# Patient Record
Sex: Male | Born: 1965 | Race: White | Hispanic: No | Marital: Married | Smoking: Never smoker
Health system: Southern US, Community
[De-identification: ages and names within clinical notes are randomized; demographics above are authoritative.]

## PROBLEM LIST (undated history)

## (undated) DIAGNOSIS — J45909 Unspecified asthma, uncomplicated: Secondary | ICD-10-CM

## (undated) HISTORY — PX: PLEURAL SCARIFICATION: SHX748

---

## 2011-02-08 ENCOUNTER — Telehealth (INDEPENDENT_AMBULATORY_CARE_PROVIDER_SITE_OTHER): Payer: Self-pay | Admitting: Emergency Medicine

## 2011-02-08 ENCOUNTER — Inpatient Hospital Stay (INDEPENDENT_AMBULATORY_CARE_PROVIDER_SITE_OTHER)
Admission: RE | Admit: 2011-02-08 | Discharge: 2011-02-08 | Disposition: A | Discharge status: Home / Self Care | Payer: Self-pay | Source: Ambulatory Visit | Attending: Emergency Medicine | Admitting: Emergency Medicine

## 2011-02-08 ENCOUNTER — Encounter: Payer: Self-pay | Admitting: Emergency Medicine

## 2011-02-08 DIAGNOSIS — M25529 Pain in unspecified elbow: Secondary | ICD-10-CM

## 2011-02-08 DIAGNOSIS — M7710 Lateral epicondylitis, unspecified elbow: Secondary | ICD-10-CM | POA: Insufficient documentation

## 2011-02-13 ENCOUNTER — Telehealth (INDEPENDENT_AMBULATORY_CARE_PROVIDER_SITE_OTHER): Payer: Self-pay | Admitting: Emergency Medicine

## 2011-06-14 NOTE — Telephone Encounter (Signed)
  Phone Note Outgoing Call   Call placed by: Lavell Islam RN,  February 13, 2011 10:02 AM Call placed to: Patient Action Taken: Phone Call Completed Summary of Call: Spoke with wife of patient; will phone in Rx for Mobic per Dr.Henderson. Initial call taken by: Lavell Islam RN,  February 13, 2011 10:03 AM

## 2011-06-14 NOTE — Telephone Encounter (Signed)
  Phone Note Outgoing Call Call back at [facility] Phone [phone]   Call placed by: Emilio Math,  February 08, 2011 1:38 PM Call placed to: Patient Summary of Call: Insurance company refused rx prescribed by Dr Orson Aloe.  So I called pt and left msg to use Aleve 2x per day or 800mg s ibuprofen q 8 hrs, continue to wear the brace.

## 2011-06-14 NOTE — Progress Notes (Signed)
Summary: LT ELBOW PAIN Room 5   Vital Signs:  Patient Profile:   45 Years Old Male CC:      Pain in leftelbow x 1 month Height:     74 inches Weight:      214 pounds O2 Sat:      84 % O2 treatment:    Room Air Temp:     98.4 degrees F oral Pulse rate:   84 / minute Pulse rhythm:   regular Resp:     16 per minute BP sitting:   121 / 84  (left arm) Cuff size:   regular  Vitals Entered By: Emilio Math (February 08, 2011 10:52 AM)                  Current Allergies: No known allergies History of Present Illness History from: patient Chief Complaint: Pain in leftelbow x 1 month History of Present Illness: L elbow pain for 1 month. 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  It hurts on the outside especially while moving his elbow and wrist.  No meds or modalities tried.  He is concerned with a tendon problem.  Not an athlete.    REVIEW OF SYSTEMS Constitutional Symptoms      Denies fever, chills, night sweats, weight loss, weight gain, and fatigue.  Eyes       Denies change in vision, eye pain, eye discharge, glasses, contact lenses, and eye surgery. Ear/Nose/Throat/Mouth       Denies hearing loss/aids, change in hearing, ear pain, ear discharge, dizziness, frequent runny nose, frequent nose bleeds, sinus problems, sore throat, hoarseness, and tooth pain or bleeding.  Respiratory       Denies dry cough, productive cough, wheezing, shortness of breath, asthma, bronchitis, and emphysema/COPD.  Cardiovascular       Denies murmurs, chest pain, and tires easily with exhertion.    Gastrointestinal       Denies stomach pain, nausea/vomiting, diarrhea, constipation, blood in bowel movements, and indigestion. Genitourniary       Denies painful urination, kidney stones, and loss of urinary control. Neurological       Denies paralysis, seizures, and fainting/blackouts. Musculoskeletal       Complains of joint pain, joint stiffness, and  decreased range of motion.      Denies muscle pain, redness, swelling, muscle weakness, and gout.  Skin       Denies bruising, unusual mles/lumps or sores, and hair/skin or nail changes.  Psych       Denies mood changes, temper/anger issues, anxiety/stress, speech problems, depression, and sleep problems.  Past History:  Past Surgical History: Pneumothrorax surgery 1992  Family History: Mother, diabetes, HTN Father, Healthy  Social History: Non smoker ETOH-yes No Drugs UPS Physical Exam General appearance: well developed, well nourished, no acute distress MSE: oriented to time, place, and person L elbow: FROM flex, ext, sup, pron.  TTP at lateral epicondyle and with resisted wrist extension.  No pain med epicondyle, olecranon, anterior, or with resisted wrist flexion.  No bruising or swelling.  Distal NV status intact. Assessment New Problems: ELBOW PAIN (ICD-719.42) LATERAL EPICONDYLITIS (ICD-726.32)   Plan New Medications/Changes: VOLTAREN 1 % GEL (DICLOFENAC SODIUM) apply two times a day for 2 weeks  #100g x 0, 02/08/2011, Hoyt Koch MD  New Orders: New Patient Level II [99202] Strapping of  elbow/wrist [29260] Planning Comments:   Given counterforce brace that helps immediately.  Also Rx for Voltaren gel.  Avoid lifting, pushing, pulling.  If not improving in 2 weeks, consider Xray and/or injection.   The patient and/or caregiver has been counseled thoroughly with regard to medications prescribed including dosage, schedule, interactions, rationale for use, and possible side effects and they verbalize understanding.  Diagnoses and expected course of recovery discussed and will return if not improved as expected or if the condition worsens. Patient and/or caregiver verbalized understanding.  Prescriptions: VOLTAREN 1 % GEL (DICLOFENAC SODIUM) apply two times a day for 2 weeks  #100g x 0   Entered and Authorized by:   Hoyt Koch MD   Signed by:   Hoyt Koch MD on 02/08/2011   Method used:   Electronically to        [facility] [phone]* (retail)       [address]       [place], [place]  [address]       Ph: [phone] or [phone]       Fax: [phone]   RxID:   [id number]   Orders Added: 1)  New Patient Level II [99202] 2)  Strapping of  elbow/wrist [29260]

## 2011-07-09 ENCOUNTER — Emergency Department (INDEPENDENT_AMBULATORY_CARE_PROVIDER_SITE_OTHER)
Admission: EM | Admit: 2011-07-09 | Discharge: 2011-07-09 | Disposition: A | Discharge status: Home / Self Care | Payer: 59 | Source: Home / Self Care | Attending: Family Medicine | Admitting: Family Medicine

## 2011-07-09 ENCOUNTER — Encounter: Payer: Self-pay | Admitting: Emergency Medicine

## 2011-07-09 DIAGNOSIS — J029 Acute pharyngitis, unspecified: Secondary | ICD-10-CM

## 2011-07-09 DIAGNOSIS — J069 Acute upper respiratory infection, unspecified: Secondary | ICD-10-CM

## 2011-07-09 NOTE — ED Provider Notes (Signed)
History     CSN: [id number]  Arrival date & time 07/09/11  1043   First MD Initiated Contact with Patient 07/09/11 1246      Chief Complaint  Patient presents with  . Sore Throat      HPI Comments: Patient complains of approximately 3 to 4 day history of gradually progressive URI symptoms beginning with a mild sore throat (now improved), followed by progressive nasal congestion.  A cough followed.  Complains of fatigue but no myalgias.  Cough is now worse at night and generally non-productive during the day.  There has been no pleuritic pain, shortness of breath, or wheezes.  He has not had a flu shot this season.  Patient is a 45 y.o. male presenting with pharyngitis. The history is provided by the patient.  Sore Throat This is a new problem. The current episode started more than 2 days ago. The problem occurs constantly. The problem has been gradually worsening. Pertinent negatives include no chest pain, no abdominal pain, no headaches and no shortness of breath. The symptoms are aggravated by swallowing and coughing. The symptoms are relieved by nothing.    Past Medical History  Diagnosis Date  . Pneumothorax     Past Surgical History  Procedure Date  . Pleural scarification     Family History  Problem Relation Age of Onset  . Diabetes Mother   . Hypertension Mother   . Hypertension Father     History  Substance Use Topics  . Smoking status: Never Smoker   . Smokeless tobacco: Not on file  . Alcohol Use: No      Review of Systems  Respiratory: Negative for shortness of breath.   Cardiovascular: Negative for chest pain.  Gastrointestinal: Negative for abdominal pain.  Neurological: Negative for headaches.   + sore throat + cough No pleuritic pain No wheezing + nasal congestion + post-nasal drainage No sinus pain/pressure No itchy/red eyes No earache No hemoptysis No SOB No fever/chills No nausea No vomiting No abdominal pain No diarrhea No  urinary symptoms No skin rashes + fatigue No myalgias No headache Used OTC meds without relief  Allergies  Review of patient's allergies indicates no known allergies.  Home Medications  No current outpatient prescriptions on file.  BP 104/77  Pulse 92  Temp(Src) 98.2 F (36.8 C) (Oral)  Resp 16  Ht 6\' 2"  (1.88 m)  Wt 214 lb (97.07 kg)  BMI 27.48 kg/m2  SpO2 98%  Physical Exam Nursing notes and Vital Signs reviewed. Appearance:  Patient appears healthy, stated age, and in no acute distress Eyes:  Pupils are equal, round, and reactive to light and accomodation.  Extraocular movement is intact.  Conjunctivae are not inflamed  Ears:  Canals normal.  Tympanic membranes normal.  Nose:  Mildly congested turbinates.  No sinus tenderness.   Pharynx:  Normal Neck:  Supple.  Slightly tender shotty anterior/posterior nodes are palpated bilaterally  Lungs:  Clear to auscultation.  Breath sounds are equal.  Heart:  Regular rate and rhythm without murmurs, rubs, or gallops.  Abdomen:  Nontender without masses or hepatosplenomegaly.  Bowel sounds are present.  No CVA or flank tenderness.  Extremities:  No edema.  No calf tenderness Skin:  No rash present.   ED Course  Procedures none   Labs Reviewed  POCT RAPID STREP A (OFFICE) Negative      1. Acute pharyngitis   2. Acute upper respiratory infections of unspecified site       MDM  There is no evidence of bacterial infection today.   Treat symptomatically for now: Take Mucinex D (guaifenesin with decongestant) twice daily for congestion.  Increase fluid intake, rest. May use Afrin nasal spray (or generic oxymetazoline) twice daily for about 5 days.  Also recommend using saline nasal spray several times daily and/or saline nasal irrigation. Stop all antihistamines for now, and other non-prescription cough/cold preparations. May take Delsym Cough Suppressant at bedtime for nighttime cough.  Recommend flu shot when  well. Follow-up with family doctor if not improving about one week.         Donna Christen, MD 07/09/11 [phone]

## 2011-07-09 NOTE — ED Notes (Signed)
Sore throat and congestion x 4 days; son dx with Type A Flu 2 weeks ago; pt. Has not had flu vaccination.

## 2011-07-12 ENCOUNTER — Telehealth: Payer: Self-pay | Admitting: Emergency Medicine

## 2012-05-10 ENCOUNTER — Emergency Department (INDEPENDENT_AMBULATORY_CARE_PROVIDER_SITE_OTHER)
Admission: EM | Admit: 2012-05-10 | Discharge: 2012-05-10 | Disposition: A | Discharge status: Home / Self Care | Payer: 59 | Source: Home / Self Care | Attending: Family Medicine | Admitting: Family Medicine

## 2012-05-10 ENCOUNTER — Encounter: Payer: Self-pay | Admitting: *Deleted

## 2012-05-10 DIAGNOSIS — J069 Acute upper respiratory infection, unspecified: Secondary | ICD-10-CM

## 2012-05-10 HISTORY — DX: Unspecified asthma, uncomplicated: J45.909

## 2012-05-10 MED ORDER — BENZONATATE 200 MG PO CAPS: 200.0000 mg | ORAL_CAPSULE | Freq: Every day | ORAL | Status: DC

## 2012-05-10 MED ORDER — AZITHROMYCIN 250 MG PO TABS: ORAL_TABLET | ORAL | Status: DC

## 2012-05-10 NOTE — ED Notes (Signed)
Patient c/o fever, chills, sinus congestion (yellow mucous), body aches and "burning throat at times". Denies painful swallowing.

## 2012-05-10 NOTE — ED Provider Notes (Signed)
History     CSN: [id number]  Arrival date & time 05/10/12  1537   First MD Initiated Contact with Patient 05/10/12 [phone]      Chief Complaint  Patient presents with  . Generalized Body Aches  . Nasal Congestion      HPI Comments: Patient complains of 2 day history of gradually progressive URI symptoms beginning with low grade fever, myalgias, sweats, headache, and fatigue.  A sore throat started yesterday. He has had minimal cough. There has been no pleuritic pain, shortness of breath, or wheezes.  Several family members with similar illness.  The history is provided by the patient.    Past Medical History  Diagnosis Date  . Pneumothorax   . Asthma     Past Surgical History  Procedure Date  . Pleural scarification     Family History  Problem Relation Age of Onset  . Diabetes Mother   . Hypertension Mother   . Hypertension Father     History  Substance Use Topics  . Smoking status: Never Smoker   . Smokeless tobacco: Not on file  . Alcohol Use: No      Review of Systems + sore throat ? cough No pleuritic pain No wheezing + nasal congestion + post-nasal drainage No sinus pain/pressure No itchy/red eyes No earache No hemoptysis No SOB + fever/chills No nausea No vomiting No abdominal pain No diarrhea No urinary symptoms No skin rashes + fatigue + myalgias + headache Used OTC meds without relief  Allergies  Review of patient's allergies indicates no known allergies.  Home Medications   Current Outpatient Rx  Name Route Sig Dispense Refill  . AZITHROMYCIN 250 MG PO TABS  Take 2 tabs today; then begin one tab once daily for 4 more days (Rx void after 05/18/12) 6 each 0  . BENZONATATE 200 MG PO CAPS Oral Take 1 capsule (200 mg total) by mouth at bedtime. Take as needed for cough 12 capsule 0    BP 141/84  Pulse 89  Temp 98.2 F (36.8 C) (Oral)  Resp 14  Ht 6\' 1"  (1.854 m)  Wt 205 lb (92.987 kg)  BMI 27.05 kg/m2  SpO2 98%  Physical  Exam Nursing notes and Vital Signs reviewed. Appearance:  Patient appears healthy, stated age, and in no acute distress Eyes:  Pupils are equal, round, and reactive to light and accomodation.  Extraocular movement is intact.  Conjunctivae are not inflamed  Ears:  Canals normal.  Tympanic membranes normal.  Nose:  Mildly congested turbinates.  No sinus tenderness.   Pharynx:  Minimal erythema Neck:  Supple.  Tender posterior nodes are palpated bilaterally  Lungs:  Clear to auscultation.  Breath sounds are equal.  Heart:  Regular rate and rhythm without murmurs, rubs, or gallops.  Abdomen:  Nontender without masses or hepatosplenomegaly.  Bowel sounds are present.  No CVA or flank tenderness.  Extremities:  No edema.  No calf tenderness Skin:  No rash present.   ED Course  Procedures none      1. Acute upper respiratory infections of unspecified site; suspect early viral URI.  Centor score 0       MDM   There is no evidence of bacterial infection today.   Treat symptomatically for now  Prescription written for Benzonatate (Tessalon) to take at bedtime for night-time cough.  Take Mucinex D (guaifenesin with decongestant) twice daily for congestion.  Increase fluid intake, rest. May use Afrin nasal spray (or generic oxymetazoline) twice daily  for about 5 days.  Also recommend using saline nasal spray several times daily and saline nasal irrigation (AYR is a common brand) Stop all antihistamines for now, and other non-prescription cough/cold preparations. May take Ibuprofen 200mg , 4 tabs every 8 hours with food for sore throat, headache, body aches, etc. Begin Azithromycin if not improving about 5 days or if persistent fever develops. Follow-up with family doctor if not improving 7 to 10 days        Lattie Haw, MD 05/10/12 [phone]

## 2012-05-11 MED ORDER — ALBUTEROL SULFATE HFA 108 (90 BASE) MCG/ACT IN AERS: 2.0000 | INHALATION_SPRAY | RESPIRATORY_TRACT | Status: DC | PRN

## 2012-09-19 ENCOUNTER — Emergency Department (INDEPENDENT_AMBULATORY_CARE_PROVIDER_SITE_OTHER)
Admission: EM | Admit: 2012-09-19 | Discharge: 2012-09-19 | Disposition: A | Discharge status: Home / Self Care | Payer: 59 | Source: Home / Self Care

## 2012-09-19 ENCOUNTER — Encounter: Payer: Self-pay | Admitting: *Deleted

## 2012-09-19 DIAGNOSIS — S336XXA Sprain of sacroiliac joint, initial encounter: Secondary | ICD-10-CM

## 2012-09-19 MED ORDER — CYCLOBENZAPRINE HCL 10 MG PO TABS: 10.0000 mg | ORAL_TABLET | Freq: Three times a day (TID) | ORAL | Status: DC | PRN

## 2012-09-19 NOTE — ED Provider Notes (Signed)
History     CSN: [id number]  Arrival date & time 09/19/12  1443   None     Chief Complaint  Patient presents with  . Back Pain       HPI Comments: Patient complains of two day history of low back pain without a precipitating event or injury.  The pain is better when sitting, and worse standing/walking.  No bowel or bladder dysfunction.  No saddle numbness.  Patient is a 47 y.o. male presenting with back pain. The history is provided by the patient.  Back Pain Location:  Lumbar spine Quality:  Shooting Radiates to:  Does not radiate Pain severity:  Moderate Pain is:  Worse during the day Onset quality:  Gradual Duration:  1 day Timing:  Constant Progression:  Unchanged Chronicity:  Recurrent Relieved by:  Nothing Ineffective treatments:  NSAIDs Associated symptoms: no abdominal pain, no abdominal swelling, no bladder incontinence, no bowel incontinence, no chest pain, no dysuria, no fever, no headaches, no leg pain, no numbness, no paresthesias, no perianal numbness, no tingling, no weakness and no weight loss     Past Medical History  Diagnosis Date  . Pneumothorax   . Asthma     Past Surgical History  Procedure Laterality Date  . Pleural scarification      Family History  Problem Relation Age of Onset  . Diabetes Mother   . Hypertension Mother   . Hypertension Father     History  Substance Use Topics  . Smoking status: Never Smoker   . Smokeless tobacco: Not on file  . Alcohol Use: No      Review of Systems  Constitutional: Negative for fever and weight loss.  Cardiovascular: Negative for chest pain.  Gastrointestinal: Negative for abdominal pain and bowel incontinence.  Genitourinary: Negative for bladder incontinence and dysuria.  Musculoskeletal: Positive for back pain.  Neurological: Negative for tingling, weakness, numbness, headaches and paresthesias.  All other systems reviewed and are negative.    Allergies  Review of patient's  allergies indicates no known allergies.  Home Medications   Current Outpatient Rx  Name  Route  Sig  Dispense  Refill  . albuterol (PROVENTIL HFA;VENTOLIN HFA) 108 (90 BASE) MCG/ACT inhaler   Inhalation   Inhale 2 puffs into the lungs every 4 (four) hours as needed for wheezing.   1 Inhaler   0   . cyclobenzaprine (FLEXERIL) 10 MG tablet   Oral   Take 1 tablet (10 mg total) by mouth 3 (three) times daily as needed for muscle spasms.   20 tablet   1     BP 121/72  Pulse 73  Temp(Src) 97.8 F (36.6 C) (Oral)  Ht 6\' 2"  (1.88 m)  Wt 209 lb (94.802 kg)  BMI 26.82 kg/m2  SpO2 99%  Physical Exam Nursing notes and Vital Signs reviewed. Appearance:  Patient appears healthy, stated age, and in no acute distress Eyes:  Pupils are equal, round, and reactive to light and accomodation.  Extraocular movement is intact.  Conjunctivae are not inflamed  Pharynx:  Normal Neck:  Supple.  No adenopathy  Lungs:  Clear to auscultation.  Breath sounds are equal.  Heart:  Regular rate and rhythm without murmurs, rubs, or gallops.  Abdomen:  Nontender without masses or hepatosplenomegaly.  Bowel sounds are present.  No CVA or flank tenderness.  Extremities:  No edema.  No calf tenderness Skin:  No rash present.  Back:   Can heel/toe walk and squat without difficulty.  Decreased  forward flexion.  Tenderness in the midline and bilateral paraspinous muscles from L3/4 to Sacral area.  Straight leg raising test is negative.  Sitting knee extension test is negative.  Strength and sensation in the lower extremities is normal.  Patellar and achilles reflexes are normal   ED Course  Procedures  none      1. Low back sprain, initial encounter       MDM  Begin Flexeril. Apply ice pack for 30 to 45 minutes three or four times daily.  Continue until pain decreases.  May take Ibuprofen 200mg , 4 tabs every 8 hours with food.  Begin back stretching and range of motion exercises in about 3 to 5 days  [employer] information and instruction handout given)  Followup with [facility] if not improving about two weeks.         Lattie Haw, MD 09/25/12 2029

## 2012-09-19 NOTE — ED Notes (Signed)
Pt c/o low back pain x 1 day. Denies injury. He took IBF yesterday with no relief.

## 2013-04-21 ENCOUNTER — Encounter: Payer: Self-pay | Admitting: Emergency Medicine

## 2013-04-21 ENCOUNTER — Emergency Department (INDEPENDENT_AMBULATORY_CARE_PROVIDER_SITE_OTHER)
Admission: EM | Admit: 2013-04-21 | Discharge: 2013-04-21 | Disposition: A | Discharge status: Home / Self Care | Payer: 59 | Source: Home / Self Care | Attending: Family Medicine | Admitting: Family Medicine

## 2013-04-21 ENCOUNTER — Emergency Department (INDEPENDENT_AMBULATORY_CARE_PROVIDER_SITE_OTHER): Payer: 59

## 2013-04-21 DIAGNOSIS — M7742 Metatarsalgia, left foot: Secondary | ICD-10-CM

## 2013-04-21 DIAGNOSIS — M79609 Pain in unspecified limb: Secondary | ICD-10-CM

## 2013-04-21 DIAGNOSIS — M25572 Pain in left ankle and joints of left foot: Secondary | ICD-10-CM

## 2013-04-21 DIAGNOSIS — M25579 Pain in unspecified ankle and joints of unspecified foot: Secondary | ICD-10-CM

## 2013-04-21 MED ORDER — MELOXICAM 15 MG PO TABS: 15.0000 mg | ORAL_TABLET | Freq: Every day | ORAL | Status: DC

## 2013-04-21 NOTE — ED Provider Notes (Signed)
CSN: [id number]     Arrival date & time 04/21/13  1243 History   First MD Initiated Contact with Patient 04/21/13 1246     Chief Complaint  Patient presents with  . Foot Pain    HPI  L foot pain x 2 weeks No known injury.  Does stand for prolonged periods of time with work (works at [employer]).  Prior hx/o plantar fasciitis.  Pain predominantly over 3rd-5th metatarsals.  On dorsal and plantar aspects, though predominantly on plantar surface.  Has tried OTC insoles with minimal improvement in sxs.  No burning or tingling.  Pain worse after prolonged shifts at work.  Past Medical History  Diagnosis Date  . Pneumothorax   . Asthma    Past Surgical History  Procedure Laterality Date  . Pleural scarification     Family History  Problem Relation Age of Onset  . Diabetes Mother   . Hypertension Mother   . Hypertension Father    History  Substance Use Topics  . Smoking status: Never Smoker   . Smokeless tobacco: Not on file  . Alcohol Use: No    Review of Systems  All other systems reviewed and are negative.    Allergies  Review of patient's allergies indicates no known allergies.  Home Medications   Current Outpatient Rx  Name  Route  Sig  Dispense  Refill  . albuterol (PROVENTIL HFA;VENTOLIN HFA) 108 (90 BASE) MCG/ACT inhaler   Inhalation   Inhale 2 puffs into the lungs every 4 (four) hours as needed for wheezing.   1 Inhaler   0   . cyclobenzaprine (FLEXERIL) 10 MG tablet   Oral   Take 1 tablet (10 mg total) by mouth 3 (three) times daily as needed for muscle spasms.   20 tablet   1    BP 102/67  Temp(Src) 97.5 F (36.4 C) (Oral)  Resp 16  Ht 6\' 2"  (1.88 m)  Wt 206 lb (93.441 kg)  BMI 26.44 kg/m2  SpO2 97% Physical Exam  Constitutional: He appears well-developed and well-nourished.  HENT:  Head: Normocephalic and atraumatic.  Eyes: Conjunctivae are normal. Pupils are equal, round, and reactive to light.  Neck: Normal range of motion. Neck supple.    Cardiovascular: Normal rate and regular rhythm.   Pulmonary/Chest: Effort normal and breath sounds normal.  Abdominal: Soft.  Musculoskeletal:       Feet:  + TTP over affected area predominantly on plantar aspect.  Neurovascularly intact able to bear weight.  No plantar TTP with toe dorsiflexion.    Neurological: He is alert.  Skin: Skin is warm.    ED Course  Procedures (including critical care time) Labs Review Labs Reviewed - No data to display Imaging Review No results found.    MDM   1. Pain in joint, ankle and foot, left   2. Metatarsalgia, left    Exam consistent with metatarsalgia.  Xray WNL.  RICE and NSAIDs.  Post op shoe with metarsal pad. Symptomatic improvement at bedside.  Discussed general and MSK red flags at length with pt.  Follow up with sports medicine in 1-2 weeks if sxs not improved.     The patient and/or caregiver has been counseled thoroughly with regard to treatment plan and/or medications prescribed including dosage, schedule, interactions, rationale for use, and possible side effects and they verbalize understanding. Diagnoses and expected course of recovery discussed and will return if not improved as expected or if the condition worsens. Patient and/or caregiver verbalized understanding.  Doree Albee, MD 04/21/13 1339

## 2013-04-21 NOTE — ED Notes (Signed)
Reports left foot pain without known injury x 2 weeks; wondered if plantar faschitis (which he has had in past), but is in different areas of foot. Has iced and did take ibuprofen 2 days ago.

## 2014-02-09 IMAGING — CR DG FOOT COMPLETE 3+V*L*
3 series · 3 of 3 positions shown · non-contrast
Comparison: None.

CLINICAL DATA: Pain across metatarsal area of left foot after
walking on a heart surface 1 and half days ago.

EXAM:
LEFT FOOT - COMPLETE 3+ VIEW

[view not recorded (1 of 3)]
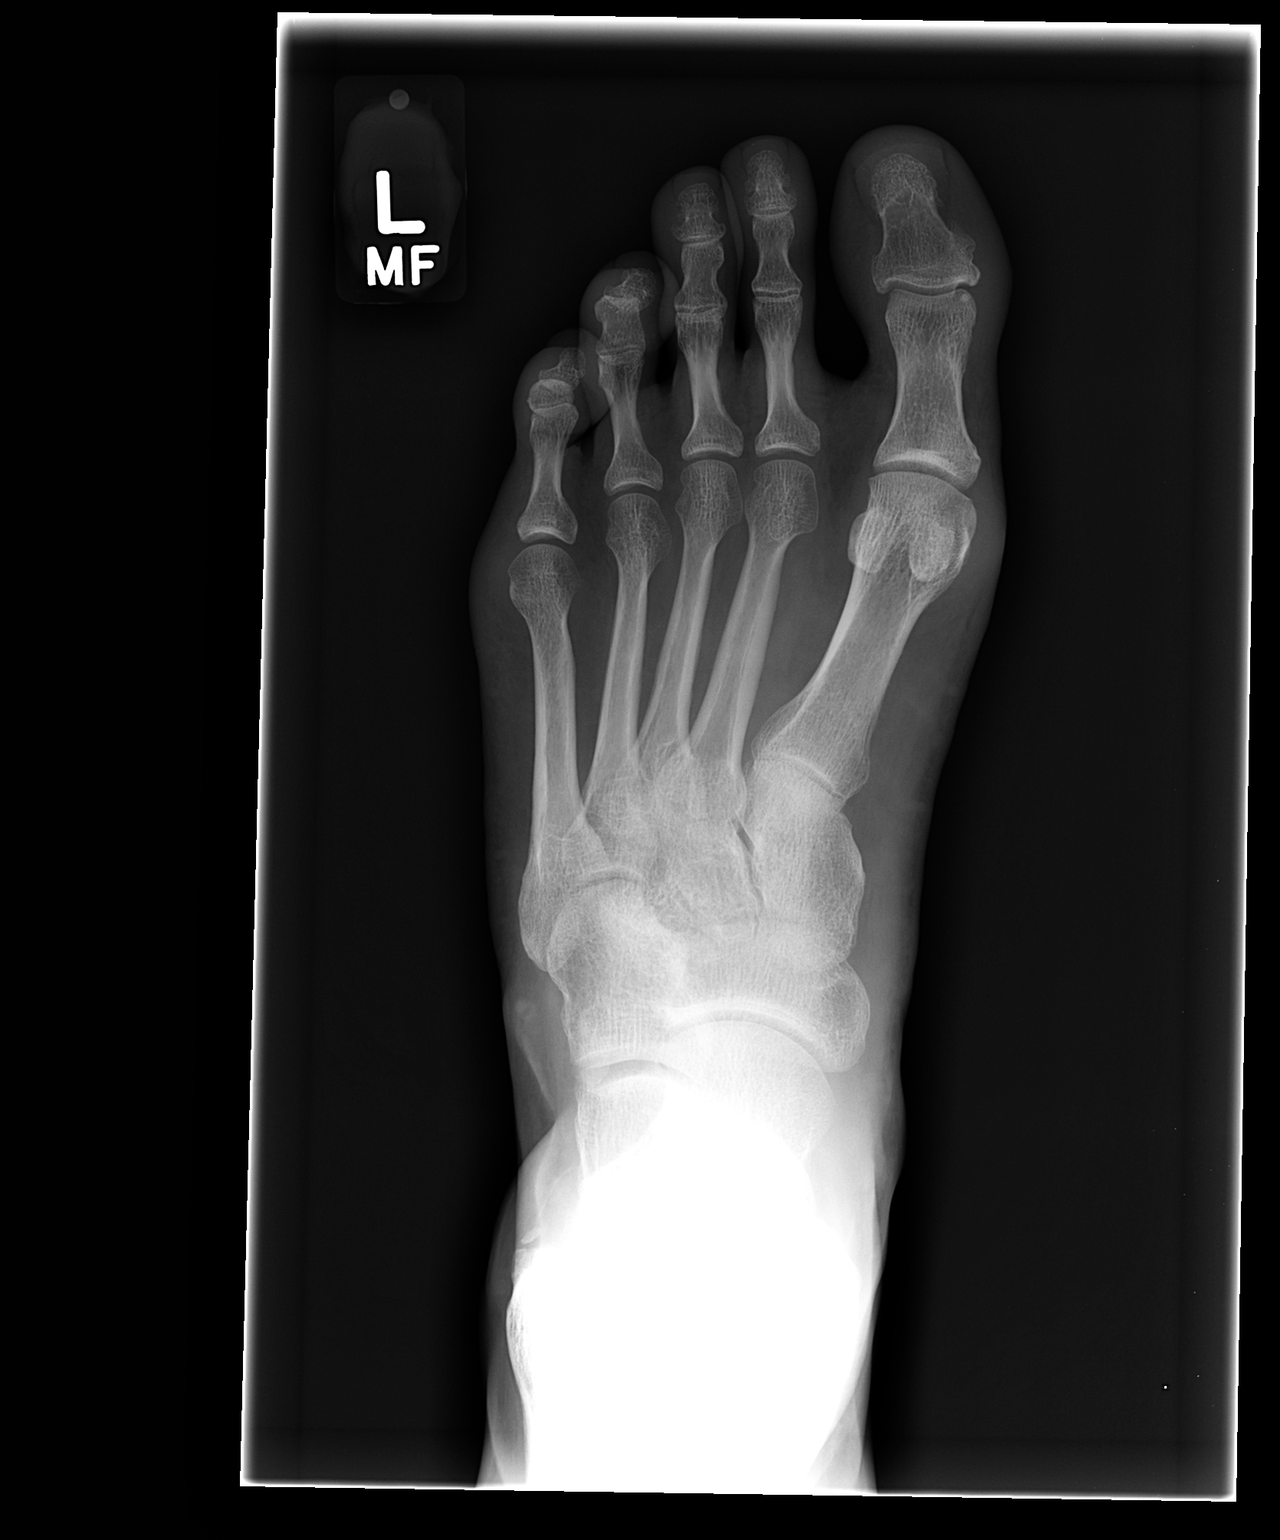

[view not recorded (2 of 3)]
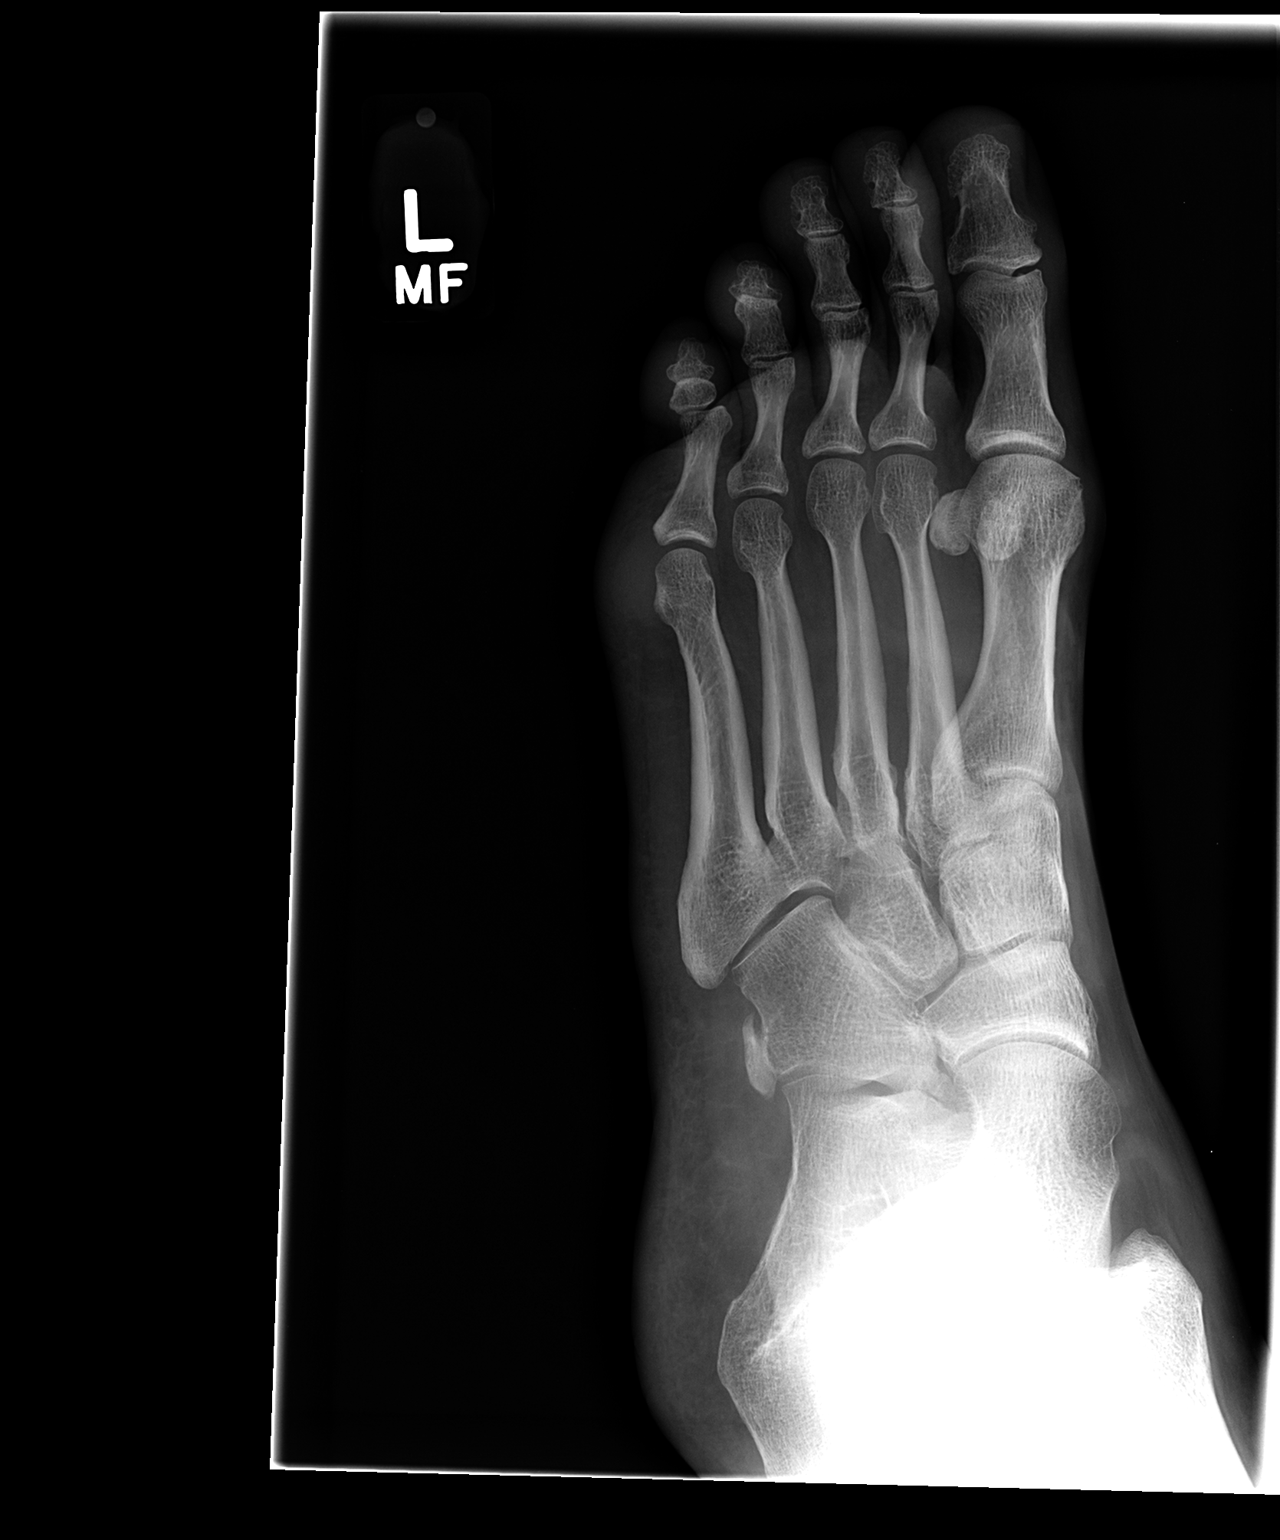

[view not recorded (3 of 3)]
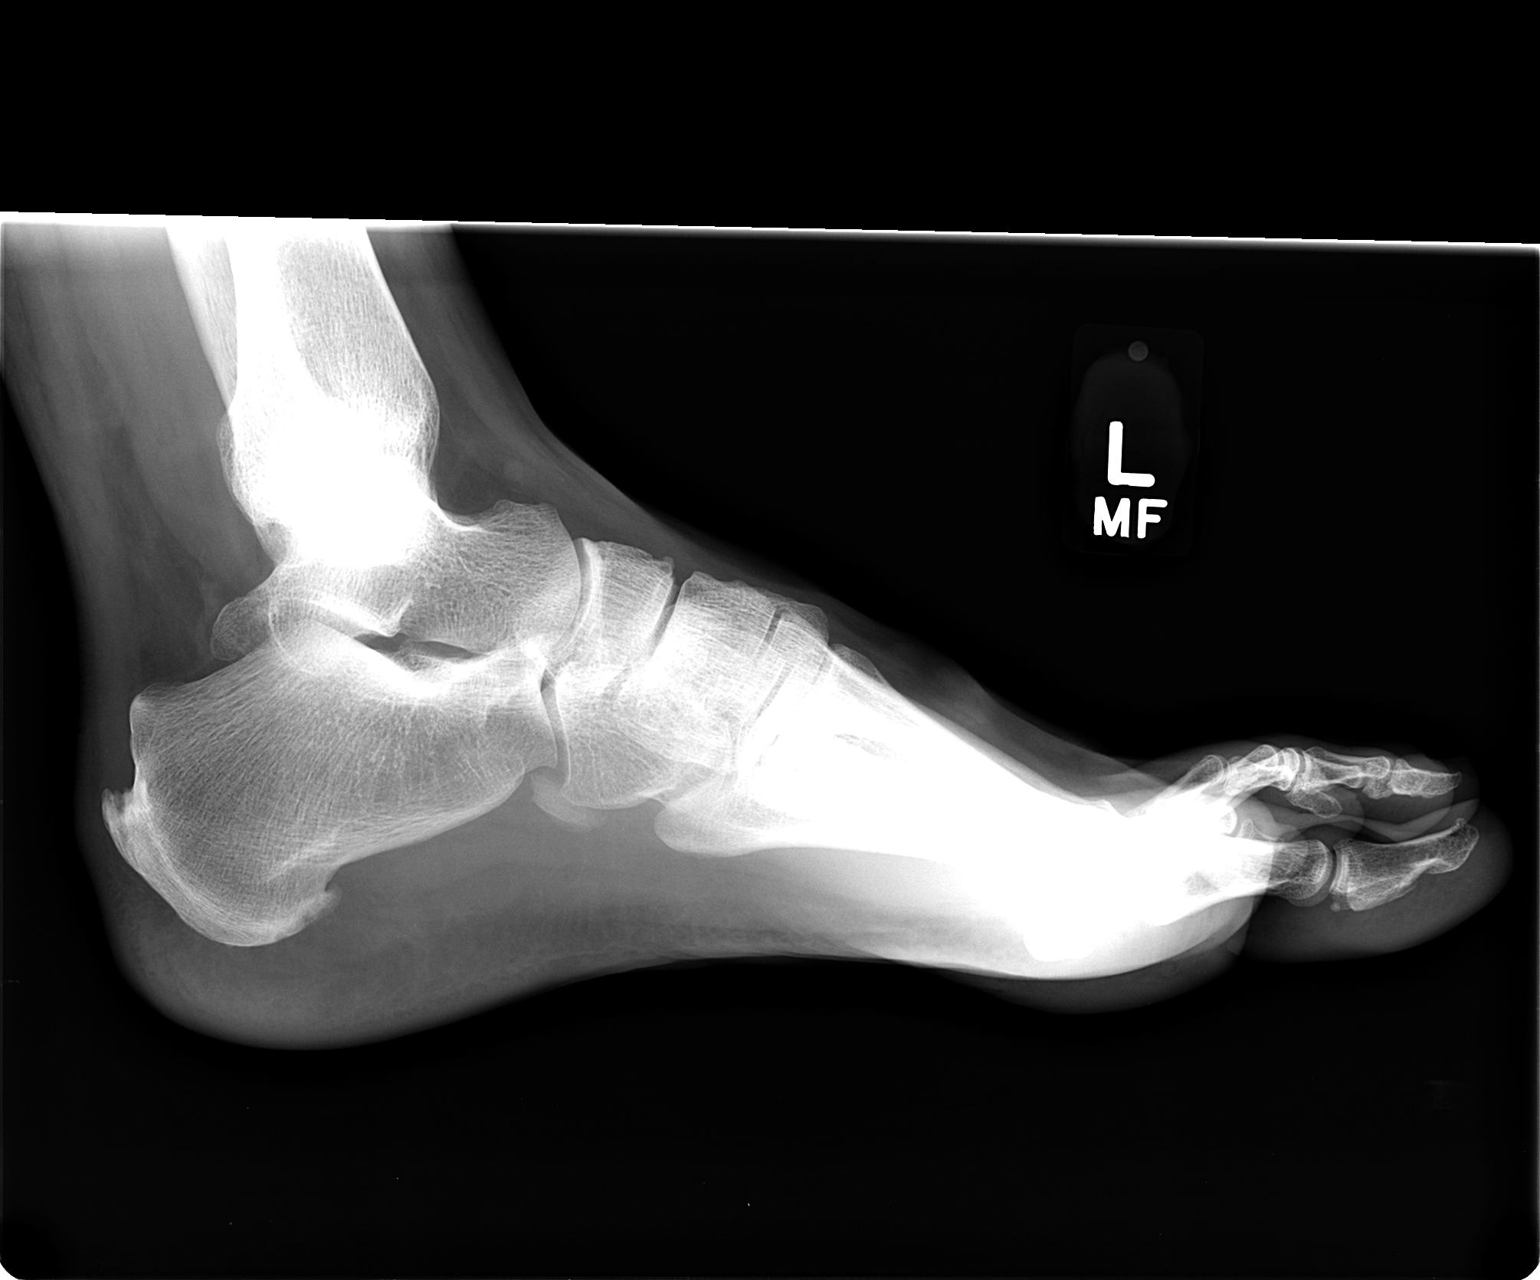

[3 of 3 positions shown; findings below may reference images not displayed]

FINDINGS: There is no evidence of fracture or dislocation. No evidence of a
metatarsal stress fracture. There is no evidence of arthropathy or
other focal bone abnormality. Soft tissues are unremarkable.
IMPRESSION: Negative.

## 2014-05-21 ENCOUNTER — Emergency Department (INDEPENDENT_AMBULATORY_CARE_PROVIDER_SITE_OTHER)
Admission: EM | Admit: 2014-05-21 | Discharge: 2014-05-21 | Disposition: A | Discharge status: Home / Self Care | Payer: 59 | Source: Home / Self Care | Attending: Physician Assistant | Admitting: Physician Assistant

## 2014-05-21 ENCOUNTER — Encounter: Payer: Self-pay | Admitting: *Deleted

## 2014-05-21 DIAGNOSIS — L259 Unspecified contact dermatitis, unspecified cause: Secondary | ICD-10-CM

## 2014-05-21 MED ORDER — CEPHALEXIN 500 MG PO CAPS: 500.0000 mg | ORAL_CAPSULE | Freq: Two times a day (BID) | ORAL | Status: DC

## 2014-05-21 MED ORDER — TRIAMCINOLONE ACETONIDE 0.1 % EX OINT: 1.0000 "application " | TOPICAL_OINTMENT | Freq: Two times a day (BID) | CUTANEOUS | Status: DC

## 2014-05-21 NOTE — ED Provider Notes (Signed)
CSN: [id number]     Arrival date & time 05/21/14  1148 History   First MD Initiated Contact with Patient 05/21/14 1202     Chief Complaint  Patient presents with  . Insect Bite   (Consider location/radiation/quality/duration/timing/severity/associated sxs/prior Treatment) HPI  Pt presents to the clinic with what he believes is a insect bit to back of left ear lobe 6 days ago. Describes itching and swelling but no pain or tingling. He has used calamine, hydrogen peroxide and neosporin. Does not seem to be spreading but not getting better. No fever, chills, n/v/d. Admits to being in woods a lot last week hunting.   Past Medical History  Diagnosis Date  . Pneumothorax   . Asthma    Past Surgical History  Procedure Laterality Date  . Pleural scarification     Family History  Problem Relation Age of Onset  . Diabetes Mother   . Hypertension Mother   . Hypertension Father    History  Substance Use Topics  . Smoking status: Never Smoker   . Smokeless tobacco: Not on file  . Alcohol Use: No    Review of Systems  All other systems reviewed and are negative.   Allergies  Review of patient's allergies indicates no known allergies.  Home Medications   Prior to Admission medications   Medication Sig Start Date End Date Taking? Authorizing Provider  albuterol (PROVENTIL HFA;VENTOLIN HFA) 108 (90 BASE) MCG/ACT inhaler Inhale 2 puffs into the lungs every 4 (four) hours as needed for wheezing. 05/11/12 05/11/13  Lattie HawStephen A Beese, MD  cephALEXin (KEFLEX) 500 MG capsule Take 1 capsule (500 mg total) by mouth 2 (two) times daily. For 7 days. 05/21/14   Myers Tutterow L Raziel Koenigs, PA-C  cyclobenzaprine (FLEXERIL) 10 MG tablet Take 1 tablet (10 mg total) by mouth 3 (three) times daily as needed for muscle spasms. 09/19/12   Lattie HawStephen A Beese, MD  meloxicam (MOBIC) 15 MG tablet Take 1 tablet (15 mg total) by mouth daily. 04/21/13   Doree AlbeeSteven Newton, MD  triamcinolone ointment (KENALOG) 0.1 % Apply 1  application topically 2 (two) times daily. 05/21/14   Kazue Cerro L Etha Stambaugh, PA-C   BP 104/69 mmHg  Pulse 95  Temp(Src) 98.6 F (37 C) (Oral)  Resp 16  Ht 6\' 2"  (1.88 m)  Wt 213 lb (96.616 kg)  BMI 27.34 kg/m2  SpO2 98% Physical Exam  Constitutional: He is oriented to person, place, and time. He appears well-developed and well-nourished.  HENT:  Head: Normocephalic and atraumatic.  Right Ear: External ear normal.  Nose: Nose normal.  Mouth/Throat: Oropharynx is clear and moist. No oropharyngeal exudate.  Left posterior ear lob erythema with what appears to be a blister that is closed. Surround dry scaly appearance.   Eyes: Conjunctivae are normal.  Neck: Normal range of motion. Neck supple.  Lymphadenopathy:    He has no cervical adenopathy.  Neurological: He is alert and oriented to person, place, and time.  Skin: Skin is dry.  Psychiatric: He has a normal mood and affect. His behavior is normal.    ED Course  Procedures (including critical care time) Labs Review Labs Reviewed - No data to display  Imaging Review No results found.   MDM   1. Contact dermatitis    Discussed appears to be inflamed and not infected.  Appears like he came in contact with something that gave him a reaction.  Given triamcinolone bid for no more than 2 weeks.  No improvement or worsening given keflex(printed  out) for 7 days.  Follow up with any new worsening symptoms.  Stop all other OTC treatments. Given HO for contact dermatitis.      Jomarie LongsJade L Tiberius Loftus, PA-C 05/21/14 1222

## 2014-05-21 NOTE — ED Notes (Signed)
Pt c/o insect bite to his LT ear x 6 days ago with itching and swelling. He has applied Calamine lotion, peroxide and neosporin.

## 2014-05-21 NOTE — Discharge Instructions (Signed)
Don't use keflex unless worsening or no improvement in 48 hours.   Contact Dermatitis Contact dermatitis is a reaction to certain substances that touch the skin. Contact dermatitis can be either irritant contact dermatitis or allergic contact dermatitis. Irritant contact dermatitis does not require previous exposure to the substance for a reaction to occur.Allergic contact dermatitis only occurs if you have been exposed to the substance before. Upon a repeat exposure, your body reacts to the substance.  CAUSES  Many substances can cause contact dermatitis. Irritant dermatitis is most commonly caused by repeated exposure to mildly irritating substances, such as:  Makeup.  Soaps.  Detergents.  Bleaches.  Acids.  Metal salts, such as nickel. Allergic contact dermatitis is most commonly caused by exposure to:  Poisonous plants.  Chemicals (deodorants, shampoos).  Jewelry.  Latex.  Neomycin in triple antibiotic cream.  Preservatives in products, including clothing. SYMPTOMS  The area of skin that is exposed may develop:  Dryness or flaking.  Redness.  Cracks.  Itching.  Pain or a burning sensation.  Blisters. With allergic contact dermatitis, there may also be swelling in areas such as the eyelids, mouth, or genitals.  DIAGNOSIS  Your caregiver can usually tell what the problem is by doing a physical exam. In cases where the cause is uncertain and an allergic contact dermatitis is suspected, a patch skin test may be performed to help determine the cause of your dermatitis. TREATMENT Treatment includes protecting the skin from further contact with the irritating substance by avoiding that substance if possible. Barrier creams, powders, and gloves may be helpful. Your caregiver may also recommend:  Steroid creams or ointments applied 2 times daily. For best results, soak the rash area in cool water for 20 minutes. Then apply the medicine. Cover the area with a plastic  wrap. You can store the steroid cream in the refrigerator for a "chilly" effect on your rash. That may decrease itching. Oral steroid medicines may be needed in more severe cases.  Antibiotics or antibacterial ointments if a skin infection is present.  Antihistamine lotion or an antihistamine taken by mouth to ease itching.  Lubricants to keep moisture in your skin.  Burow's solution to reduce redness and soreness or to dry a weeping rash. Mix one packet or tablet of solution in 2 cups cool water. Dip a clean washcloth in the mixture, wring it out a bit, and put it on the affected area. Leave the cloth in place for 30 minutes. Do this as often as possible throughout the day.  Taking several cornstarch or baking soda baths daily if the area is too large to cover with a washcloth. Harsh chemicals, such as alkalis or acids, can cause skin damage that is like a burn. You should flush your skin for 15 to 20 minutes with cold water after such an exposure. You should also seek immediate medical care after exposure. Bandages (dressings), antibiotics, and pain medicine may be needed for severely irritated skin.  HOME CARE INSTRUCTIONS  Avoid the substance that caused your reaction.  Keep the area of skin that is affected away from hot water, soap, sunlight, chemicals, acidic substances, or anything else that would irritate your skin.  Do not scratch the rash. Scratching may cause the rash to become infected.  You may take cool baths to help stop the itching.  Only take over-the-counter or prescription medicines as directed by your caregiver.  See your caregiver for follow-up care as directed to make sure your skin is healing properly.  SEEK MEDICAL CARE IF:   Your condition is not better after 3 days of treatment.  You seem to be getting worse.  You see signs of infection such as swelling, tenderness, redness, soreness, or warmth in the affected area.  You have any problems related to your  medicines. Document Released: 06/25/2000 Document Revised: 09/20/2011 Document Reviewed: 12/01/2010 [facility] Patient Information 2015 [place], [place]. This information is not intended to replace advice given to you by your health care provider. Make sure you discuss any questions you have with your health care provider.

## 2016-10-08 ENCOUNTER — Encounter: Payer: Self-pay | Admitting: Emergency Medicine

## 2016-10-08 ENCOUNTER — Emergency Department (INDEPENDENT_AMBULATORY_CARE_PROVIDER_SITE_OTHER)
Admission: EM | Admit: 2016-10-08 | Discharge: 2016-10-08 | Disposition: A | Discharge status: Home / Self Care | Payer: BLUE CROSS/BLUE SHIELD | Source: Home / Self Care | Attending: Family Medicine | Admitting: Family Medicine

## 2016-10-08 DIAGNOSIS — M7021 Olecranon bursitis, right elbow: Secondary | ICD-10-CM

## 2016-10-08 NOTE — ED Triage Notes (Signed)
Pt c/o hit his right elbow about 4 months ago, twice in the same spot, no pain, just feeling some inflammation.

## 2016-10-08 NOTE — ED Provider Notes (Signed)
[name]    CSN: [id number] Arrival date & time: 10/08/16  1526     History   Chief Complaint Chief Complaint  Patient presents with  . Joint Swelling    HPI Travis Welch is a 51 y.o. male.   Patient reports that he hit his right elbow twice about 4 months ago, and has had some persistent mild swelling with minimal soreness.   The history is provided by the patient.  Arm Injury  Location:  Elbow Elbow location:  R elbow Injury: yes   Time since incident:  4 months Mechanism of injury comment:  Contusion Pain details:    Severity:  No pain   Onset quality:  Gradual   Duration:  4 months   Timing:  Constant   Progression:  Unchanged Dislocation: no   Prior injury to area:  No Relieved by:  None tried Worsened by:  Nothing Ineffective treatments:  None tried Associated symptoms: swelling   Associated symptoms: no decreased range of motion, no muscle weakness, no numbness, no stiffness and no tingling   Risk factors: no frequent fractures and no recent illness     Past Medical History:  Diagnosis Date  . Asthma   . Pneumothorax     Patient Active Problem List   Diagnosis Date Noted  . ELBOW PAIN 02/08/2011  . LATERAL EPICONDYLITIS 02/08/2011    Past Surgical History:  Procedure Laterality Date  . PLEURAL SCARIFICATION         Home Medications    Prior to Admission medications   Not on File    Family History Family History  Problem Relation Age of Onset  . Diabetes Mother   . Hypertension Mother   . Hypertension Father     Social History Social History  Substance Use Topics  . Smoking status: Never Smoker  . Smokeless tobacco: Never Used  . Alcohol use No     Allergies   Patient has no known allergies.   Review of Systems Review of Systems  Musculoskeletal: Negative for stiffness.     Physical Exam Triage Vital Signs ED Triage Vitals  Enc Vitals Group     BP 10/08/16 1552 116/78     Pulse Rate 10/08/16  1552 82     Resp --      Temp 10/08/16 1552 98 F (36.7 C)     Temp Source 10/08/16 1552 Oral     SpO2 10/08/16 1552 95 %     Weight 10/08/16 1553 215 lb 8 oz (97.8 kg)     Height 10/08/16 1553  (1.88 m)     Head Circumference --      Peak Flow --      Pain Score 10/08/16 1553 0     Pain Loc --      Pain Edu? --      Excl. in GC? --    No data found.   Updated Vital Signs BP 116/78 (BP Location: Left Arm)   Pulse 82   Temp 98 F (36.7 C) (Oral)   Ht  (1.88 m)   Wt 215 lb 8 oz (97.8 kg)   SpO2 95%   BMI 27.67 kg/m   Visual Acuity Right Eye Distance:   Left Eye Distance:   Bilateral Distance:    Right Eye Near:   Left Eye Near:    Bilateral Near:     Physical Exam  Constitutional: He appears well-developed and well-nourished. No distress.  HENT:  Head: Atraumatic.  Eyes: Pupils are equal, round, and reactive to light.  Neck: Normal range of motion.  Cardiovascular: Normal rate.   Pulmonary/Chest: Effort normal.  Musculoskeletal:       Right elbow: He exhibits normal range of motion. No tenderness found.       Arms: There is minimal nontender swelling of the right olecranon bursa.  Elbow has full range of motion.  No erythema or warmth.  Neurological: He is alert.  Skin: Skin is warm and dry. No rash noted.  Nursing note and vitals reviewed.    UC Treatments / Results  Labs (all labs ordered are listed, but only abnormal results are displayed) Labs Reviewed - No data to display  EKG  EKG Interpretation None       Radiology No results found.  Procedures Procedures (including critical care time)  Medications Ordered in UC Medications - No data to display   Initial Impression / Assessment and Plan / UC Course  I have reviewed the triage vital signs and the nursing notes.  Pertinent labs & imaging results that were available during my care of the patient were reviewed by me and considered in my medical decision making (see chart for  details).    Ace wrap applied. Wear ace wrap to decrease swelling.  Protect elbow from further trauma.  Apply ice pack for 20 to 30 minutes, 3 to 4 times daily  Continue until pain and swelling decrease.  May take Aleve two tabs every 12 hours. Followup with Dr. Rodney Langton or Dr. Clementeen [facility] ([facility]) if not improving about two weeks.    Final Clinical Impressions(s) / UC Diagnoses   Final diagnoses:  Olecranon bursitis of right elbow    New Prescriptions New Prescriptions   No medications on file     Lattie Haw, MD 10/18/16 1147

## 2016-10-08 NOTE — Discharge Instructions (Signed)
Wear ace wrap to decrease swelling.  Protect elbow from further trauma.  Apply ice pack for 20 to 30 minutes, 3 to 4 times daily  Continue until pain and swelling decrease.  May take Aleve two tabs every 12 hours.

## 2016-11-09 ENCOUNTER — Ambulatory Visit (INDEPENDENT_AMBULATORY_CARE_PROVIDER_SITE_OTHER): Payer: BLUE CROSS/BLUE SHIELD | Admitting: Family Medicine

## 2016-11-09 VITALS — BP 112/76 | HR 77 | Temp 98.0°F | Wt 212.0 lb

## 2016-11-09 DIAGNOSIS — R55 Syncope and collapse: Secondary | ICD-10-CM | POA: Diagnosis not present

## 2016-11-09 DIAGNOSIS — M7021 Olecranon bursitis, right elbow: Secondary | ICD-10-CM | POA: Diagnosis not present

## 2016-11-09 DIAGNOSIS — R002 Palpitations: Secondary | ICD-10-CM | POA: Diagnosis not present

## 2016-11-09 NOTE — Progress Notes (Signed)
   Subjective:    I'm seeing this patient as a consultation for:  Dr Cathren Harsh  CC: Right Elbow Olecranon Bursits  HPI: Patient notes about one month history of right olecranon bursitis. He was seen in urgent care after hitting his elbow and diagnosed with olecranon bursitis. He's had a trial of relative rest and some compression with oral NSAIDs. He notes this really hasn't helped much. He notes swelling at the tip of his elbow. He denies any radiating pain weakness or numbness.  Near syncope: Patient works in a [employer], counselling and notes that the other day he became somewhat lightheaded and dizzy after working hard. He denies chest pain or palpitations and is worried about his heart. This isn't incidentally noted finding that he would like workup today. His primary care provider is at [facility].  Past medical history, Surgical history, Family history not pertinant except as noted below, Social history, Allergies, and medications have been entered into the medical record, reviewed, and no changes needed.   Review of Systems: No headache, visual changes, nausea, vomiting, diarrhea, constipation, dizziness, abdominal pain, skin rash, fevers, chills, night sweats, weight loss, swollen lymph nodes, body aches, joint swelling, muscle aches, chest pain, shortness of breath, mood changes, visual or auditory hallucinations.   Objective:    Vitals:   11/09/16 1501  BP: 112/76  Pulse: 77  Temp: 98 F (36.7 C)   General: Well Developed, well nourished, and in no acute distress.  Neuro/Psych: Alert and oriented x3, extra-ocular muscles intact, able to move all 4 extremities, sensation grossly intact. Skin: Warm and dry, no rashes noted.  Respiratory: Not using accessory muscles, speaking in full sentences, trachea midline.  Cardiovascular: Pulses palpable, no extremity edema. Heart regular rate and rhythm no murmurs rubs or gallops. Abdomen: Does not appear distended. MSK:  Right  elbow: Soft slightly swollen area at the olecranon consistent with olecranon bursitis. Nontender. Normal elbow motion. Pulses capillary refill sensation.  12-lead EKG shows normal sinus rhythm at 72 bpm. Normal EKG.  Procedure: Real-time Ultrasound Guided Injection of right olecranon bursa  Device: GE Logiq E  Images permanently stored and available for review in the ultrasound unit. Verbal informed consent obtained. Discussed risks and benefits of procedure. Warned about infection bleeding damage to structures skin hypopigmentation and fat atrophy among others. Patient expresses understanding and agreement Time-out conducted.  Noted no overlying erythema, induration, or other signs of local infection.  Skin prepped in a sterile fashion.  Local anesthesia: Topical Ethyl chloride.  With sterile technique and under real time ultrasound guidance: 1.35ml lidocaine and  dexamethasone injected easily.  Completed without difficulty  Advised to call if fevers/chills, erythema, induration, drainage, or persistent bleeding.  Images permanently stored and available for review in the ultrasound unit.  Impression: Technically successful ultrasound guided injection.    No results found for this or any previous visit (from the past 24 hour(s)). No results found.  Impression and Recommendations:    Assessment and Plan: 51 y.o. male with  Right olecranon bursitis status post injection today. Plan to treat with compressive and cushioning. Recheck as needed..  Near syncope: Likely orthostasis. EKG is normal today. Recommend patient follow-up with PCP.   Discussed warning signs or symptoms. Please see discharge instructions. Patient expresses understanding.

## 2016-11-09 NOTE — Patient Instructions (Signed)
Thank you for coming in today. Use compression.  Get an elbow sleeve.  Recheck as needed.  Use the compressive sleeve with cushion while up and active for 1-2 months.   Call or go to the ER if you develop a large red swollen joint with extreme pain or oozing puss.    Elbow Bursitis Elbow bursitis is inflammation of the fluid-filled sac (bursa) between the tip of your elbow bone (olecranon) and your skin. Elbow bursitis may also be called olecranon bursitis. Normally, the olecranon bursa has only a small amount of fluid in it to cushion and protect your elbow bone. Elbow bursitis causes fluid to build up inside the bursa. Over time, this swelling and inflammation can cause pain when you bend or lean on your elbow. What are the causes? Elbow bursitis may be caused by:  Elbow injury (acute trauma).  Leaning on hard surfaces for long periods of time.  Infection from an injury that breaks the skin near your elbow.  A bone growth (spur) that forms at the tip of your elbow.  A medical condition that causes inflammation in your body, such as gout or rheumatoid arthritis. The cause may also be unknown. What are the signs or symptoms? The first sign of elbow bursitis is usually swelling over the tip of your elbow. This can grow to be the size of a golf ball. This may start suddenly or develop gradually. You may also have:  Pain when bending or leaning on your elbow.  Restricted movement of your elbow. If your bursitis is caused by an infection, symptoms may also include:  Redness, warmth, and tenderness of the elbow.  Drainage of pus from the swollen area over your elbow, if the skin breaks open. How is this diagnosed? Your health care provider may be able to diagnose elbow bursitis based on your signs and symptoms, especially if you have recently been injured. Your health care provider will also do a physical exam. This may include:  X-rays to look for a bone spur or a bone  fracture.  Draining fluid from the bursa to test it for infection.  Blood tests to rule out gout or rheumatoid arthritis. How is this treated? Treatment for elbow bursitis depends on the cause. Treatment may include:  Medicines. These may include:  Over-the-counter medicines to relieve pain and inflammation.  Antibiotic medicines to fight infection.  Injections of anti-inflammatory medicines (steroids).  Wrapping your elbow with a bandage.  Draining fluid from the bursa.  Wearing elbow pads. If your bursitis does not get better with treatment, surgery may be needed to remove the bursa. Follow these instructions at home:  Take medicines only as directed by your health care provider.  If you were prescribed an antibiotic medicine, finish all of it even if you start to feel better.  If your bursitis is caused by an injury, rest your elbow and wear your bandage as directed by your health care provider. You may alsoapply ice to the injured area as directed by your health care provider:  Put ice in a plastic bag.  Place a towel between your skin and the bag.  Leave the ice on for 20 minutes, 2-3 times per day.  Avoid any activities that cause elbow pain.  Use elbow pads or elbow wraps to cushion your elbow. Contact a health care provider if:  You have a fever.  Your symptoms do not get better with treatment.  Your pain or swelling gets worse.  Your elbow pain  or swelling goes away and then returns.  You have drainage of pus from the swollen area over your elbow. This information is not intended to replace advice given to you by your health care provider. Make sure you discuss any questions you have with your health care provider. Document Released: 07/28/2006 Document Revised: 12/04/2015 Document Reviewed: 03/06/2014 Elsevier Interactive Patient Education  2017 [employer].

## 2016-11-09 NOTE — Progress Notes (Signed)
Pt is here for follow up from [facility]

## 2016-11-10 ENCOUNTER — Telehealth: Payer: Self-pay | Admitting: *Deleted

## 2016-11-10 NOTE — Telephone Encounter (Signed)
Sent through Epic. °

## 2016-11-10 NOTE — Telephone Encounter (Signed)
-----   Message from Rodolph Bong, MD sent at 11/09/2016  7:52 PM EDT ----- Please send a copy of this note to pcp at [facility]

## 2019-06-15 ENCOUNTER — Other Ambulatory Visit: Payer: Self-pay

## 2019-06-15 ENCOUNTER — Emergency Department (INDEPENDENT_AMBULATORY_CARE_PROVIDER_SITE_OTHER)
Admission: EM | Admit: 2019-06-15 | Discharge: 2019-06-15 | Disposition: A | Discharge status: Home / Self Care | Payer: BC Managed Care – PPO | Source: Home / Self Care

## 2019-06-15 ENCOUNTER — Encounter: Payer: Self-pay | Admitting: Emergency Medicine

## 2019-06-15 DIAGNOSIS — R509 Fever, unspecified: Secondary | ICD-10-CM

## 2019-06-15 DIAGNOSIS — J019 Acute sinusitis, unspecified: Secondary | ICD-10-CM

## 2019-06-15 MED ORDER — AMOXICILLIN-POT CLAVULANATE 875-125 MG PO TABS: 1.0000 | ORAL_TABLET | Freq: Two times a day (BID) | ORAL | 0 refills | Status: DC

## 2019-06-15 MED ORDER — PREDNISONE 10 MG PO TABS: 40.0000 mg | ORAL_TABLET | Freq: Every day | ORAL | 0 refills | Status: AC

## 2019-06-15 NOTE — ED Provider Notes (Signed)
[facility]    CSN: [id number] Arrival date & time: 06/15/19  1729      History   Chief Complaint Chief Complaint  Patient presents with  . Fever    HPI Travis Welch is a 53 y.o. male.   53 year old male, with history of asthma, pneumothorax, presenting today complaining of possible sinusitis.  Patient states that over the past week, he has had sinus pain and pressure, congestion with some postnasal drip.  States that he has had a dry cough as well as some sore throat.  States that yesterday and today, he has had a fever.  T-max of 100.3.  Has been using Tylenol and Benadryl with good improvement of his symptoms.  States that he works for [employer] and several of his coworkers have been out recently pending Covid test results.  He denies any body aches, chest pain, shortness of breath, loss of taste or smell.  The history is provided by the patient.  Fever Max temp prior to arrival:  100.3 Temp source:  Oral Severity:  Moderate Onset quality:  Gradual Duration:  2 days Timing:  Intermittent Progression:  Unchanged Chronicity:  New Relieved by:  Acetaminophen Worsened by:  Nothing Ineffective treatments:  None tried Associated symptoms: chills, congestion, cough, rhinorrhea and sore throat   Associated symptoms: no chest pain, no confusion, no diarrhea, no dysuria, no ear pain, no headaches, no myalgias, no nausea, no rash, no somnolence and no vomiting   Risk factors: sick contacts   Risk factors: no contaminated food, no contaminated water, no hx of cancer, no immunosuppression, no occupational exposure and no recent sickness     Past Medical History:  Diagnosis Date  . Asthma   . Pneumothorax     Patient Active Problem List   Diagnosis Date Noted  . Olecranon bursitis of right elbow 11/09/2016  . ELBOW PAIN 02/08/2011  . LATERAL EPICONDYLITIS 02/08/2011    Past Surgical History:  Procedure Laterality Date  . PLEURAL SCARIFICATION         Home  Medications    Prior to Admission medications   Medication Sig Start Date End Date Taking? Authorizing Provider  amoxicillin-clavulanate (AUGMENTIN) 875-125 MG tablet Take 1 tablet by mouth every 12 (twelve) hours. 06/15/19   Perian Tedder C, PA-C  predniSONE (DELTASONE) 10 MG tablet Take 4 tablets (40 mg total) by mouth daily for 5 days. 06/15/19 06/20/19  Alecia LemmingBlue, Zamiah Tollett C, PA-C    Family History Family History  Problem Relation Age of Onset  . Diabetes Mother   . Hypertension Mother   . Hypertension Father     Social History Social History   Tobacco Use  . Smoking status: Never Smoker  . Smokeless tobacco: Never Used  Substance Use Topics  . Alcohol use: No  . Drug use: No     Allergies   Patient has no known allergies.   Review of Systems Review of Systems  Constitutional: Positive for chills and fever.  HENT: Positive for congestion, rhinorrhea, sinus pressure and sore throat. Negative for ear pain.   Eyes: Negative for pain and visual disturbance.  Respiratory: Positive for cough. Negative for shortness of breath.   Cardiovascular: Negative for chest pain and palpitations.  Gastrointestinal: Negative for abdominal pain, diarrhea, nausea and vomiting.  Genitourinary: Negative for dysuria and hematuria.  Musculoskeletal: Negative for arthralgias, back pain and myalgias.  Skin: Negative for color change and rash.  Neurological: Negative for seizures, syncope and headaches.  Psychiatric/Behavioral: Negative for  confusion.  All other systems reviewed and are negative.    Physical Exam Triage Vital Signs ED Triage Vitals  Enc Vitals Group     BP 06/15/19 1756 104/71     Pulse Rate 06/15/19 1756 100     Resp --      Temp 06/15/19 1756 98.9 F (37.2 C)     Temp Source 06/15/19 1756 Oral     SpO2 06/15/19 1756 98 %     Weight 06/15/19 1757 212 lb (96.2 kg)     Height 06/15/19 1757 6\' 2"  (1.88 m)     Head Circumference --      Peak Flow --      Pain Score 06/15/19  1757 0     Pain Loc --      Pain Edu? --      Excl. in GC? --    No data found.  Updated Vital Signs BP 104/71 (BP Location: Right Arm)   Pulse 100   Temp 98.9 F (37.2 C) (Oral)   Ht 6\' 2"  (1.88 m)   Wt 212 lb (96.2 kg)   SpO2 98%   BMI 27.22 kg/m   Visual Acuity Right Eye Distance:   Left Eye Distance:   Bilateral Distance:    Right Eye Near:   Left Eye Near:    Bilateral Near:     Physical Exam Vitals signs and nursing note reviewed.  Constitutional:      Appearance: He is well-developed.  HENT:     Head: Normocephalic and atraumatic.     Right Ear: Hearing, tympanic membrane, ear canal and external ear normal.     Left Ear: Hearing, tympanic membrane, ear canal and external ear normal.     Nose: Nose normal.     Mouth/Throat:     Pharynx: Uvula midline. No oropharyngeal exudate or posterior oropharyngeal erythema.     Tonsils: No tonsillar abscesses.  Eyes:     Conjunctiva/sclera: Conjunctivae normal.  Neck:     Musculoskeletal: Neck supple.  Cardiovascular:     Rate and Rhythm: Normal rate and regular rhythm.     Heart sounds: No murmur.  Pulmonary:     Effort: Pulmonary effort is normal. No respiratory distress.     Breath sounds: Normal breath sounds. No stridor. No decreased breath sounds, wheezing, rhonchi or rales.  Abdominal:     Palpations: Abdomen is soft.     Tenderness: There is no abdominal tenderness.  Skin:    General: Skin is warm and dry.  Neurological:     Mental Status: He is alert.      UC Treatments / Results  Labs (all labs ordered are listed, but only abnormal results are displayed) Labs Reviewed  NOVEL CORONAVIRUS, NAA    EKG   Radiology No results found.  Procedures Procedures (including critical care time)  Medications Ordered in UC Medications - No data to display  Initial Impression / Assessment and Plan / UC Course  I have reviewed the triage vital signs and the nursing notes.  Pertinent labs & imaging  results that were available during my care of the patient were reviewed by me and considered in my medical decision making (see chart for details).     Symptoms seem consistent with sinusitis.  He has had symptoms now for over a week with fever yesterday and today.  We will treat with Augmentin and prednisone pending Covid swab.  He will quarantine until receiving results. Final Clinical Impressions(s) / UC  Diagnoses   Final diagnoses:  Chills with fever  Acute sinusitis, recurrence not specified, unspecified location     Discharge Instructions     Quarantine until receiving your COVID results     ED Prescriptions    Medication Sig Dispense Auth. Provider   amoxicillin-clavulanate (AUGMENTIN) 875-125 MG tablet Take 1 tablet by mouth every 12 (twelve) hours. 14 tablet Hilda Wexler C, PA-C   predniSONE (DELTASONE) 10 MG tablet Take 4 tablets (40 mg total) by mouth daily for 5 days. 20 tablet Natally Ribera C, PA-C     PDMP not reviewed this encounter.   Phebe Colla, [place] 06/15/19 1810

## 2019-06-15 NOTE — ED Triage Notes (Signed)
Fever, chills, cough, nasal drainage x 1 week, fever yesterday and today, fatigue

## 2019-06-15 NOTE — Discharge Instructions (Addendum)
Quarantine until receiving your COVID results

## 2019-06-18 ENCOUNTER — Telehealth: Payer: Self-pay | Admitting: Emergency Medicine

## 2019-06-18 LAB — NOVEL CORONAVIRUS, NAA: SARS-CoV-2, NAA: DETECTED — AB

## 2019-06-18 MED ORDER — ALBUTEROL SULFATE HFA 108 (90 BASE) MCG/ACT IN AERS: 2.0000 | INHALATION_SPRAY | RESPIRATORY_TRACT | 0 refills | Status: DC | PRN

## 2019-06-18 NOTE — Telephone Encounter (Signed)
Spoke with patient on home phone regarding albuterol inhaler refill.  Patient receives history of asthma in childhood, has extended into adulthood during season changes.  Patient states that he feels he has some chest tightness that feels similar to previous episodes.  Denies audible wheezing, shortness of breath, lightheadedness, chest pain.  Overall, patient feels he is doing well, largely requesting albuterol inhaler on hand for seasonal symptoms.  Refill sent.  Strict ER return precautions were discussed, patient verbalized understanding.

## 2019-06-18 NOTE — Telephone Encounter (Signed)
Patient calling to request inhaler; has tightening of chest ; history of asthma and used unknown inhaler in past. He tested positive covid 06/14/19.

## 2019-06-26 ENCOUNTER — Telehealth: Payer: Self-pay

## 2019-06-26 NOTE — Telephone Encounter (Signed)
Pt had questions about returning back to work after covid pos results. All questions answered.

## 2021-06-10 ENCOUNTER — Other Ambulatory Visit: Payer: Self-pay

## 2021-06-10 ENCOUNTER — Emergency Department (INDEPENDENT_AMBULATORY_CARE_PROVIDER_SITE_OTHER)
Admission: EM | Admit: 2021-06-10 | Discharge: 2021-06-10 | Disposition: A | Discharge status: Home / Self Care | Payer: BC Managed Care – PPO | Source: Home / Self Care | Attending: Family Medicine | Admitting: Family Medicine

## 2021-06-10 DIAGNOSIS — J069 Acute upper respiratory infection, unspecified: Secondary | ICD-10-CM | POA: Diagnosis not present

## 2021-06-10 MED ORDER — AZITHROMYCIN 250 MG PO TABS: 250.0000 mg | ORAL_TABLET | Freq: Every day | ORAL | 0 refills | Status: DC

## 2021-06-10 MED ORDER — ALBUTEROL SULFATE HFA 108 (90 BASE) MCG/ACT IN AERS: 2.0000 | INHALATION_SPRAY | RESPIRATORY_TRACT | 0 refills | Status: DC | PRN

## 2021-06-10 MED ORDER — PREDNISONE 20 MG PO TABS: 20.0000 mg | ORAL_TABLET | Freq: Two times a day (BID) | ORAL | 0 refills | Status: DC

## 2021-06-10 NOTE — ED Triage Notes (Signed)
Pt states that he has some nasal congestion, coughing and sneezing. X1 week  Pt is vaccinated for covid.   Pt hasn't had his flu vaccine.

## 2021-06-10 NOTE — Discharge Instructions (Signed)
Take Z-Pak as prescribed.  Take 2 pills today then 1 a day until gone Take prednisone 2 pills a day.  Take 2 doses today Make sure you are drinking lots of water May continue over-the-counter cough and cold medicine Use the albuterol as needed

## 2021-06-10 NOTE — ED Provider Notes (Signed)
[facility]    CSN: [id number] Arrival date & time: 06/10/21  1233      History   Chief Complaint Chief Complaint  Patient presents with   Cough    Pt states that he has a cough, nasal congestion, and sneezing. X1 week    HPI Travis Welch is a 55 y.o. male.   HPI  Patient has runny and stuffy nose, postnasal drip, scratchy throat.  Headache and fatigue.  Coughing and chest congestion.  Difficulty sleeping secondary to has been sick for 8 to 9 days.  Is concerned that his cough feels like it is getting worse, more congestion.  Past Medical History:  Diagnosis Date   Asthma    Pneumothorax     Patient Active Problem List   Diagnosis Date Noted   Olecranon bursitis of right elbow 11/09/2016   ELBOW PAIN 02/08/2011   LATERAL EPICONDYLITIS 02/08/2011    Past Surgical History:  Procedure Laterality Date   PLEURAL SCARIFICATION         Home Medications    Prior to Admission medications   Medication Sig Start Date End Date Taking? Authorizing Provider  azithromycin (ZITHROMAX) 250 MG tablet Take 1 tablet (250 mg total) by mouth daily. Take first 2 tablets together, then 1 every day until finished. 06/10/21  Yes Eustace Moore, MD  predniSONE (DELTASONE) 20 MG tablet Take 1 tablet (20 mg total) by mouth 2 (two) times daily with a meal. 06/10/21  Yes Eustace Moore, MD  albuterol (VENTOLIN HFA) 108 (90 Base) MCG/ACT inhaler Inhale 2 puffs into the lungs every 4 (four) hours as needed for wheezing or shortness of breath. 06/10/21   Eustace Moore, MD    Family History Family History  Problem Relation Age of Onset   Diabetes Mother    Hypertension Mother    Hypertension Father     Social History Social History   Tobacco Use   Smoking status: Never   Smokeless tobacco: Never  Vaping Use   Vaping Use: Never used  Substance Use Topics   Alcohol use: No   Drug use: No     Allergies   Patient has no known allergies.   Review  of Systems Review of Systems See HPI  Physical Exam Triage Vital Signs ED Triage Vitals  Enc Vitals Group     BP 06/10/21 1358 131/88     Pulse Rate 06/10/21 1358 86     Resp 06/10/21 1358 18     Temp 06/10/21 1358 97.7 F (36.5 C)     Temp Source 06/10/21 1358 Oral     SpO2 06/10/21 1358 98 %     Weight 06/10/21 1356 212 lb (96.2 kg)     Height 06/10/21 1356 6\' 2"  (1.88 m)     Head Circumference --      Peak Flow --      Pain Score 06/10/21 1356 0     Pain Loc --      Pain Edu? --      Excl. in GC? --    No data found.  Updated Vital Signs BP 131/88 (BP Location: Left Arm)   Pulse 86   Temp 97.7 F (36.5 C) (Oral)   Resp 18   Ht 6\' 2"  (1.88 m)   Wt 96.2 kg   SpO2 98%   BMI 27.22 kg/m     Physical Exam Vitals reviewed.  Constitutional:      General: He is not in  acute distress.    Appearance: Normal appearance. He is well-developed.  HENT:     Head: Normocephalic and atraumatic.     Right Ear: Tympanic membrane and ear canal normal.     Left Ear: Tympanic membrane normal.     Nose: Congestion present. No rhinorrhea.     Mouth/Throat:     Pharynx: No posterior oropharyngeal erythema.  Eyes:     Conjunctiva/sclera: Conjunctivae normal.     Pupils: Pupils are equal, round, and reactive to light.  Cardiovascular:     Rate and Rhythm: Normal rate and regular rhythm.     Heart sounds: Normal heart sounds.  Pulmonary:     Effort: Pulmonary effort is normal. No respiratory distress.     Breath sounds: Wheezing and rhonchi present.     Comments: Few scattered end inspiratory wheeze.  Rhonchi and cough Abdominal:     General: There is no distension.     Palpations: Abdomen is soft.  Musculoskeletal:        General: Normal range of motion.     Cervical back: Normal range of motion.  Lymphadenopathy:     Cervical: No cervical adenopathy.  Skin:    General: Skin is warm and dry.  Neurological:     General: No focal deficit present.     Mental Status: He is  alert.     UC Treatments / Results  Labs (all labs ordered are listed, but only abnormal results are displayed) Labs Reviewed - No data to display  EKG   Radiology No results found.  Procedures Procedures (including critical care time)  Medications Ordered in UC Medications - No data to display  Initial Impression / Assessment and Plan / UC Course  I have reviewed the triage vital signs and the nursing notes.  Pertinent labs & imaging results that were available during my care of the patient were reviewed by me and considered in my medical decision making (see chart for details).    Final Clinical Impressions(s) / UC Diagnoses   Final diagnoses:  Acute upper respiratory infection     Discharge Instructions      Take Z-Pak as prescribed.  Take 2 pills today then 1 a day until gone Take prednisone 2 pills a day.  Take 2 doses today Make sure you are drinking lots of water May continue over-the-counter cough and cold medicine Use the albuterol as needed   ED Prescriptions     Medication Sig Dispense Auth. Provider   albuterol (VENTOLIN HFA) 108 (90 Base) MCG/ACT inhaler Inhale 2 puffs into the lungs every 4 (four) hours as needed for wheezing or shortness of breath. 18 g Eustace Moore, MD   azithromycin (ZITHROMAX) 250 MG tablet Take 1 tablet (250 mg total) by mouth daily. Take first 2 tablets together, then 1 every day until finished. 6 tablet Eustace Moore, MD   predniSONE (DELTASONE) 20 MG tablet Take 1 tablet (20 mg total) by mouth 2 (two) times daily with a meal. 10 tablet Eustace Moore, MD      PDMP not reviewed this encounter.   Eustace Moore, MD 06/10/21 [phone]

## 2022-06-24 ENCOUNTER — Ambulatory Visit
Admission: EM | Admit: 2022-06-24 | Discharge: 2022-06-24 | Disposition: A | Discharge status: Home / Self Care | Payer: BC Managed Care – PPO | Attending: Family Medicine | Admitting: Family Medicine

## 2022-06-24 DIAGNOSIS — J069 Acute upper respiratory infection, unspecified: Secondary | ICD-10-CM | POA: Diagnosis not present

## 2022-06-24 DIAGNOSIS — J329 Chronic sinusitis, unspecified: Secondary | ICD-10-CM | POA: Diagnosis not present

## 2022-06-24 MED ORDER — ALBUTEROL SULFATE HFA 108 (90 BASE) MCG/ACT IN AERS: 2.0000 | INHALATION_SPRAY | RESPIRATORY_TRACT | 0 refills | Status: DC | PRN

## 2022-06-24 MED ORDER — PREDNISONE 20 MG PO TABS: 20.0000 mg | ORAL_TABLET | Freq: Two times a day (BID) | ORAL | 0 refills | Status: DC

## 2022-06-24 MED ORDER — AMOXICILLIN 875 MG PO TABS: 875.0000 mg | ORAL_TABLET | Freq: Two times a day (BID) | ORAL | 0 refills | Status: DC

## 2022-06-24 NOTE — Discharge Instructions (Signed)
Make sure you are drinking plenty of fluids Take the prednisone as directed May continue over-the-counter cough and cold medicines as needed Use albuterol inhaler if you have wheezing  If you fail to improve over the next few days, or if you have worsening symptoms fill and take the amoxicillin See your doctor if not improving by next week

## 2022-06-24 NOTE — ED Triage Notes (Signed)
Pt c/o nasal congestion and facial pain since Saturday. Denies fever. Taking alkaselzter and theraflu prn. Hx of sinus infections. COVID at home neg this morning.

## 2022-06-24 NOTE — ED Provider Notes (Signed)
[facility]    CSN: [id number] Arrival date & time: 06/24/22  1108      History   Chief Complaint Chief Complaint  Patient presents with   Nasal Congestion   Facial Pain    HPI Travis Welch is a 56 y.o. male.   HPI  Patient has nasal congestion and facial pressure since Saturday.  For the last 5 days he has had postnasal drip, cough, and mild sore throat.  COVID testing was negative at home.  He has been taking over-the-counter cough and cold medicine such as Alka-Seltzer and TheraFlu.  States he has a history of sinus infections.  No purulent drainage.  Past Medical History:  Diagnosis Date   Asthma    Pneumothorax     Patient Active Problem List   Diagnosis Date Noted   Olecranon bursitis of right elbow 11/09/2016   ELBOW PAIN 02/08/2011   LATERAL EPICONDYLITIS 02/08/2011    Past Surgical History:  Procedure Laterality Date   PLEURAL SCARIFICATION         Home Medications    Prior to Admission medications   Medication Sig Start Date End Date Taking? Authorizing Provider  amoxicillin (AMOXIL) 875 MG tablet Take 1 tablet (875 mg total) by mouth 2 (two) times daily. 06/24/22  Yes Eustace Moore, MD  albuterol (VENTOLIN HFA) 108 (90 Base) MCG/ACT inhaler Inhale 2 puffs into the lungs every 4 (four) hours as needed for wheezing or shortness of breath. 06/24/22   Eustace Moore, MD  predniSONE (DELTASONE) 20 MG tablet Take 1 tablet (20 mg total) by mouth 2 (two) times daily with a meal. 06/24/22   Eustace Moore, MD    Family History Family History  Problem Relation Age of Onset   Diabetes Mother    Hypertension Mother    Hypertension Father     Social History Social History   Tobacco Use   Smoking status: Never   Smokeless tobacco: Never  Vaping Use   Vaping Use: Never used  Substance Use Topics   Alcohol use: No   Drug use: No     Allergies   Patient has no known allergies.   Review of Systems Review of  Systems See HPI  Physical Exam Triage Vital Signs ED Triage Vitals  Enc Vitals Group     BP 06/24/22 1123 (!) 144/83     Pulse Rate 06/24/22 1123 86     Resp 06/24/22 1123 16     Temp 06/24/22 1123 98.1 F (36.7 C)     Temp Source 06/24/22 1123 Oral     SpO2 06/24/22 1123 97 %     Weight --      Height --      Head Circumference --      Peak Flow --      Pain Score 06/24/22 1125 4     Pain Loc --      Pain Edu? --      Excl. in GC? --    No data found.  Updated Vital Signs BP (!) 144/83 (BP Location: Left Arm)   Pulse 86   Temp 98.1 F (36.7 C) (Oral)   Resp 16   SpO2 97%      Physical Exam Constitutional:      General: He is not in acute distress.    Appearance: He is well-developed.  HENT:     Head: Normocephalic and atraumatic.     Right Ear: Tympanic membrane normal.  Left Ear: Tympanic membrane normal.     Nose: Rhinorrhea present.     Comments: Clear rhinorrhea.  No sinus tenderness    Mouth/Throat:     Pharynx: No posterior oropharyngeal erythema.  Eyes:     Conjunctiva/sclera: Conjunctivae normal.     Pupils: Pupils are equal, round, and reactive to light.  Cardiovascular:     Rate and Rhythm: Normal rate and regular rhythm.     Heart sounds: Normal heart sounds.  Pulmonary:     Effort: Pulmonary effort is normal. No respiratory distress.     Breath sounds: Normal breath sounds.  Abdominal:     General: Bowel sounds are normal. There is no distension.     Palpations: Abdomen is soft.  Musculoskeletal:        General: Normal range of motion.     Cervical back: Normal range of motion.  Skin:    General: Skin is warm and dry.  Neurological:     Mental Status: He is alert.      UC Treatments / Results  Labs (all labs ordered are listed, but only abnormal results are displayed) Labs Reviewed - No data to display  EKG   Radiology No results found.  Procedures Procedures (including critical care time)  Medications Ordered in  UC Medications - No data to display  Initial Impression / Assessment and Plan / UC Course  I have reviewed the triage vital signs and the nursing notes.  Pertinent labs & imaging results that were available during my care of the patient were reviewed by me and considered in my medical decision making (see chart for details).     Reviewed that this is likely a viral infection.  Management discussed Final Clinical Impressions(s) / UC Diagnoses   Final diagnoses:  Acute upper respiratory infection  Recurrent sinusitis     Discharge Instructions      Make sure you are drinking plenty of fluids Take the prednisone as directed May continue over-the-counter cough and cold medicines as needed Use albuterol inhaler if you have wheezing  If you fail to improve over the next few days, or if you have worsening symptoms fill and take the amoxicillin See your doctor if not improving by next week   ED Prescriptions     Medication Sig Dispense Auth. Provider   predniSONE (DELTASONE) 20 MG tablet Take 1 tablet (20 mg total) by mouth 2 (two) times daily with a meal. 10 tablet Eustace Moore, MD   albuterol (VENTOLIN HFA) 108 (90 Base) MCG/ACT inhaler Inhale 2 puffs into the lungs every 4 (four) hours as needed for wheezing or shortness of breath. 18 g Eustace Moore, MD   amoxicillin (AMOXIL) 875 MG tablet Take 1 tablet (875 mg total) by mouth 2 (two) times daily. 14 tablet Eustace Moore, MD      PDMP not reviewed this encounter.   Eustace Moore, MD 06/24/22 [phone]

## 2022-08-28 ENCOUNTER — Ambulatory Visit
Admission: EM | Admit: 2022-08-28 | Discharge: 2022-08-28 | Disposition: A | Discharge status: Home / Self Care | Payer: BC Managed Care – PPO | Attending: Family Medicine | Admitting: Family Medicine

## 2022-08-28 ENCOUNTER — Encounter: Payer: Self-pay | Admitting: Emergency Medicine

## 2022-08-28 DIAGNOSIS — U071 COVID-19: Secondary | ICD-10-CM

## 2022-08-28 DIAGNOSIS — J101 Influenza due to other identified influenza virus with other respiratory manifestations: Secondary | ICD-10-CM

## 2022-08-28 LAB — POC SARS CORONAVIRUS 2 AG -  ED: SARS Coronavirus 2 Ag: POSITIVE — AB

## 2022-08-28 LAB — POCT INFLUENZA A/B
Influenza A, POC: NEGATIVE
Influenza B, POC: POSITIVE — AB

## 2022-08-28 MED ORDER — KETOROLAC TROMETHAMINE 30 MG/ML IJ SOLN
60.0000 mg | Freq: Once | INTRAMUSCULAR | Status: AC
  Administered 2022-08-28: 60 mg via INTRAMUSCULAR

## 2022-08-28 MED ORDER — IBUPROFEN 800 MG PO TABS: 800.0000 mg | ORAL_TABLET | Freq: Three times a day (TID) | ORAL | 0 refills | Status: AC

## 2022-08-28 MED ORDER — OSELTAMIVIR PHOSPHATE 75 MG PO CAPS: 75.0000 mg | ORAL_CAPSULE | Freq: Two times a day (BID) | ORAL | 0 refills | Status: DC

## 2022-08-28 NOTE — Discharge Instructions (Signed)
Stay home to rest.  You must quarantine for 5 days.  You must wear a mask for the next 5 days. Take Tamiflu 2 times a day to help reduce your flu symptoms. Take over-the-counter cough and cold medicine as needed I prescribed ibuprofen to take for the headache and body aches Call if you have problems, questions, complications  Your family members can call their own personal physician offices for preventative Tamiflu

## 2022-08-28 NOTE — ED Provider Notes (Signed)
[facility]    CSN: [id number] Arrival date & time: 08/28/22  1057      History   Chief Complaint Chief Complaint  Patient presents with   Generalized Body Aches    HPI Travis Welch is a 57 y.o. male.   HPI  This is a healthy 58 year old gentleman.  Takes vitamin D replacement for low level measured at yearly physical examination.  Patient is here today for illness.  For the last 24 hours he is developing work concerning symptoms of headaches and body aches.  Sinus headache nasal congestion.  Cough and fatigue.  See nurses note for concerns regarding vitamin D ingestion  Past Medical History:  Diagnosis Date   Asthma    Pneumothorax     Patient Active Problem List   Diagnosis Date Noted   Olecranon bursitis of right elbow 11/09/2016   ELBOW PAIN 02/08/2011   LATERAL EPICONDYLITIS 02/08/2011    Past Surgical History:  Procedure Laterality Date   PLEURAL SCARIFICATION         Home Medications    Prior to Admission medications   Medication Sig Start Date End Date Taking? Authorizing Provider  Cholecalciferol 1.25 MG (50000 UT) capsule Take 1 capsule by mouth once a week. 07/29/22 10/27/22 Yes [provider]  ibuprofen (ADVIL) 800 MG tablet Take 1 tablet (800 mg total) by mouth 3 (three) times daily. 08/28/22  Yes Raylene Everts, MD  oseltamivir (TAMIFLU) 75 MG capsule Take 1 capsule (75 mg total) by mouth every 12 (twelve) hours. 08/28/22  Yes Raylene Everts, MD    Family History Family History  Problem Relation Age of Onset   Diabetes Mother    Hypertension Mother    Hypertension Father     Social History Social History   Tobacco Use   Smoking status: Never   Smokeless tobacco: Never  Vaping Use   Vaping Use: Never used  Substance Use Topics   Alcohol use: No   Drug use: No     Allergies   Patient has no known allergies.   Review of Systems Review of Systems  See HPI Physical Exam Triage Vital Signs ED  Triage Vitals  Enc Vitals Group     BP 08/28/22 1113 113/79     Pulse Rate 08/28/22 1113 92     Resp 08/28/22 1113 18     Temp 08/28/22 1113 99.3 F (37.4 C)     Temp Source 08/28/22 1113 Oral     SpO2 08/28/22 1113 99 %     Weight 08/28/22 1117 220 lb (99.8 kg)     Height 08/28/22 1117 6' 1"$  (1.854 m)     Head Circumference --      Peak Flow --      Pain Score 08/28/22 1117 8     Pain Loc --      Pain Edu? --      Excl. in [place]? --    No data found.  Updated Vital Signs BP 113/79 (BP Location: Left Arm)   Pulse 92   Temp 99.3 F (37.4 C) (Oral)   Resp 18   Ht 6' 1"$  (1.854 m)   Wt 99.8 kg   SpO2 99%   BMI 29.03 kg/m  :     Physical Exam Constitutional:      General: He is not in acute distress.    Appearance: He is well-developed and normal weight. He is ill-appearing.  HENT:     Head: Normocephalic  and atraumatic.  Eyes:     Conjunctiva/sclera: Conjunctivae normal.     Pupils: Pupils are equal, round, and reactive to light.  Cardiovascular:     Rate and Rhythm: Normal rate.  Pulmonary:     Effort: Pulmonary effort is normal. No respiratory distress.  Abdominal:     General: There is no distension.     Palpations: Abdomen is soft.  Musculoskeletal:        General: Normal range of motion.     Cervical back: Normal range of motion.  Skin:    General: Skin is warm and dry.  Neurological:     Mental Status: He is alert.      UC Treatments / Results  Labs (all labs ordered are listed, but only abnormal results are displayed) Labs Reviewed  POC SARS CORONAVIRUS 2 AG -  ED - Abnormal; Notable for the following components:      Result Value   SARS Coronavirus 2 Ag Positive (*)    All other components within normal limits  POCT INFLUENZA A/B - Abnormal; Notable for the following components:   Influenza B, POC Positive (*)    All other components within normal limits    EKG   Radiology No results found.  Procedures Procedures (including critical  care time)  Medications Ordered in UC Medications  ketorolac (TORADOL) 30 MG/ML injection 60 mg (has no administration in time range)    Initial Impression / Assessment and Plan / UC Course  I have reviewed the triage vital signs and the nursing notes.  Pertinent labs & imaging results that were available during my care of the patient were reviewed by me and considered in my medical decision making (see chart for details).     Discussed with patient his test results.  Will treat for flu since it is clearly indicated.  He is a low risk person for complications from [place]; non-smoker, age 35, no medical problems or underlying illness Final Clinical Impressions(s) / UC Diagnoses   Final diagnoses:  COVID-19  Influenza B     Discharge Instructions      Stay home to rest.  You must quarantine for 5 days.  You must wear a mask for the next 5 days. Take Tamiflu 2 times a day to help reduce your flu symptoms. Take over-the-counter cough and cold medicine as needed I prescribed ibuprofen to take for the headache and body aches Call if you have problems, questions, complications  Your family members can call their own personal physician offices for preventative Tamiflu     ED Prescriptions     Medication Sig Dispense Auth. Provider   oseltamivir (TAMIFLU) 75 MG capsule Take 1 capsule (75 mg total) by mouth every 12 (twelve) hours. 10 capsule Raylene Everts, MD   ibuprofen (ADVIL) 800 MG tablet Take 1 tablet (800 mg total) by mouth 3 (three) times daily. 21 tablet Raylene Everts, MD      PDMP not reviewed this encounter.   Raylene Everts, MD 08/28/22 [phone]

## 2022-08-28 NOTE — ED Triage Notes (Addendum)
Body aches w/ sinus  HA & chills x 24 hours  Cough  Fatigue  OTC alka seltzer plus 0900 - no relief Pt also stated that he had taken  his prescription strength Vitamin D daily for the month of January instead if weekly & is currently taking Vitamin D3 5000 iu daily Pt has not followed up with PCP to make them error of error in dosing schedule for vitamin D

## 2024-03-03 ENCOUNTER — Other Ambulatory Visit: Payer: Self-pay

## 2024-03-03 ENCOUNTER — Ambulatory Visit
Admission: EM | Admit: 2024-03-03 | Discharge: 2024-03-03 | Disposition: A | Discharge status: Home / Self Care | Attending: Family Medicine | Admitting: Family Medicine

## 2024-03-03 DIAGNOSIS — L237 Allergic contact dermatitis due to plants, except food: Secondary | ICD-10-CM | POA: Diagnosis not present

## 2024-03-03 DIAGNOSIS — R21 Rash and other nonspecific skin eruption: Secondary | ICD-10-CM | POA: Diagnosis not present

## 2024-03-03 MED ORDER — METHYLPREDNISOLONE SODIUM SUCC 125 MG IJ SOLR
125.0000 mg | Freq: Once | INTRAMUSCULAR | Status: AC
  Administered 2024-03-03: 125 mg via INTRAMUSCULAR

## 2024-03-03 MED ORDER — PREDNISONE 10 MG (21) PO TBPK: ORAL_TABLET | Freq: Every day | ORAL | 0 refills | Status: AC

## 2024-03-03 NOTE — ED Provider Notes (Signed)
 [facility]    CSN: [id number] Arrival date & time: 03/03/24  1137      History   Chief Complaint Chief Complaint  Patient presents with   Rash    Poison oak    HPI Travis Welch is a 58 y.o. male.   HPI 58 year old male presents with rash believed to be poison ivy/poison oak for 1 week.  PMH significant for asthma and history of pneumothorax.  Past Medical History:  Diagnosis Date   Asthma    Pneumothorax     Patient Active Problem List   Diagnosis Date Noted   Olecranon bursitis of right elbow 11/09/2016   ELBOW PAIN 02/08/2011   Lateral epicondylitis 02/08/2011    Past Surgical History:  Procedure Laterality Date   PLEURAL SCARIFICATION         Home Medications    Prior to Admission medications   Medication Sig Start Date End Date Taking? Authorizing Provider  predniSONE  (STERAPRED UNI-PAK 21 TAB) 10 MG (21) TBPK tablet Take by mouth daily. Take 6 tabs by mouth daily  for 2 days, then 5 tabs for 2 days, then 4 tabs for 2 days, then 3 tabs for 2 days, 2 tabs for 2 days, then 1 tab by mouth daily for 2 days 03/03/24  Yes Teddy Sharper, FNP  ibuprofen  (ADVIL ) 800 MG tablet Take 1 tablet (800 mg total) by mouth 3 (three) times daily. 08/28/22   Maranda Jamee Jacob, MD    Family History Family History  Problem Relation Age of Onset   Diabetes Mother    Hypertension Mother    Hypertension Father     Social History Social History   Tobacco Use   Smoking status: Never   Smokeless tobacco: Never  Vaping Use   Vaping status: Never Used  Substance Use Topics   Alcohol use: No   Drug use: No     Allergies   Patient has no known allergies.   Review of Systems Review of Systems  Skin:  Positive for rash.  All other systems reviewed and are negative.    Physical Exam Triage Vital Signs ED Triage Vitals  Encounter Vitals Group     BP      Girls Systolic BP Percentile      Girls Diastolic BP Percentile      Boys Systolic BP  Percentile      Boys Diastolic BP Percentile      Pulse      Resp      Temp      Temp src      SpO2      Weight      Height      Head Circumference      Peak Flow      Pain Score      Pain Loc      Pain Education      Exclude from Growth Chart    No data found.  Updated Vital Signs BP 108/74 (BP Location: Right Arm)   Pulse 87   Temp 98.4 F (36.9 C) (Oral)   Resp 17   SpO2 95%    Physical Exam Vitals and nursing note reviewed.  Constitutional:      Appearance: Normal appearance. He is normal weight.  HENT:     Head: Normocephalic and atraumatic.     Mouth/Throat:     Mouth: Mucous membranes are moist.     Pharynx: Oropharynx is clear.  Eyes:  Extraocular Movements: Extraocular movements intact.     Pupils: Pupils are equal, round, and reactive to light.  Cardiovascular:     Rate and Rhythm: Normal rate and regular rhythm.     Pulses: Normal pulses.     Heart sounds: Normal heart sounds.  Pulmonary:     Effort: Pulmonary effort is normal.     Breath sounds: Normal breath sounds. No wheezing, rhonchi or rales.  Musculoskeletal:        General: Normal range of motion.     Cervical back: Normal range of motion and neck supple.  Skin:    General: Skin is warm and dry.     Comments: Right lower arm (volar aspect)/right torso/left lower arm (volar aspect): Pruritic, erythematous, maculopapular, linear vesicular lesions, with clear removed bullae noted-please see images below  Neurological:     General: No focal deficit present.     Mental Status: He is alert and oriented to person, place, and time. Mental status is at baseline.  Psychiatric:        Mood and Affect: Mood normal.        Behavior: Behavior normal.        Thought Content: Thought content normal.           UC Treatments / Results  Labs (all labs ordered are listed, but only abnormal results are displayed) Labs Reviewed - No data to display  EKG   Radiology No results  found.  Procedures Procedures (including critical care time)  Medications Ordered in UC Medications  methylPREDNISolone  sodium succinate (SOLU-MEDROL ) 125 mg/2 mL injection 125 mg (125 mg Intramuscular Given 03/03/24 1222)    Initial Impression / Assessment and Plan / UC Course  I have reviewed the triage vital signs and the nursing notes.  Pertinent labs & imaging results that were available during my care of the patient were reviewed by me and considered in my medical decision making (see chart for details).     MDM: 1.  Poison ivy dermatitis-IM Solu-Medrol  125 mg given once in clinic and prior to discharge; 2.  Rash and nonspecific skin eruption-Rx'd Sterapred Unipak (42 tab 10 mg taper). Advised patient to take medication as directed with food to completion.  Encouraged to increase daily water intake to 64 ounces per day while taking this medication.  Advised if symptoms worsen and/or unresolved please follow-up with your PCP or here for further evaluation.  Patient discharged home, hemodynamically stable. Final Clinical Impressions(s) / UC Diagnoses   Final diagnoses:  Rash and nonspecific skin eruption  Poison ivy dermatitis     Discharge Instructions      Advised patient to take medication as directed with food to completion.  Encouraged to increase daily water intake to 64 ounces per day while taking this medication.  Advised if symptoms worsen and/or unresolved please follow-up with your PCP or here for further evaluation.     ED Prescriptions     Medication Sig Dispense Auth. Provider   predniSONE  (STERAPRED UNI-PAK 21 TAB) 10 MG (21) TBPK tablet Take by mouth daily. Take 6 tabs by mouth daily  for 2 days, then 5 tabs for 2 days, then 4 tabs for 2 days, then 3 tabs for 2 days, 2 tabs for 2 days, then 1 tab by mouth daily for 2 days 42 tablet Teddy Sharper, FNP      PDMP not reviewed this encounter.   Teddy Sharper, FNP 03/03/24 1223

## 2024-03-03 NOTE — ED Triage Notes (Signed)
 Pt c/o rash x 1 week he thinks due to poison oak/ivy. Slowly spreading over last few days.  Calamine and itch spray prn.

## 2024-03-03 NOTE — Discharge Instructions (Addendum)
 Advised patient to take medication as directed with food to completion.  Encouraged to increase daily water intake to 64 ounces per day while taking this medication.  Advised if symptoms worsen and/or unresolved please follow-up with your PCP or here for further evaluation.
